# Patient Record
Sex: Female | Born: 1990 | Race: White | Hispanic: No | Smoking: Former smoker
Health system: Southern US, Community
[De-identification: ages and names within clinical notes are randomized; demographics above are authoritative.]

## PROBLEM LIST (undated history)

## (undated) DIAGNOSIS — L309 Dermatitis, unspecified: Secondary | ICD-10-CM

## (undated) DIAGNOSIS — J309 Allergic rhinitis, unspecified: Secondary | ICD-10-CM

## (undated) DIAGNOSIS — D219 Benign neoplasm of connective and other soft tissue, unspecified: Secondary | ICD-10-CM

## (undated) DIAGNOSIS — F419 Anxiety disorder, unspecified: Secondary | ICD-10-CM

## (undated) HISTORY — DX: Benign neoplasm of connective and other soft tissue, unspecified: D21.9

## (undated) HISTORY — DX: Anxiety disorder, unspecified: F41.9

## (undated) HISTORY — DX: Dermatitis, unspecified: L30.9

## (undated) HISTORY — DX: Allergic rhinitis, unspecified: J30.9

## (undated) HISTORY — PX: WISDOM TOOTH EXTRACTION: SHX21

---

## 2016-01-23 ENCOUNTER — Ambulatory Visit (INDEPENDENT_AMBULATORY_CARE_PROVIDER_SITE_OTHER): Payer: Self-pay | Admitting: Family Medicine

## 2016-01-23 ENCOUNTER — Encounter: Payer: Self-pay | Admitting: Family Medicine

## 2016-01-23 VITALS — BP 133/83 | HR 117 | Temp 98.8°F | Ht 66.0 in | Wt 258.0 lb

## 2016-01-23 DIAGNOSIS — H66001 Acute suppurative otitis media without spontaneous rupture of ear drum, right ear: Secondary | ICD-10-CM

## 2016-01-23 MED ORDER — AMOXICILLIN-POT CLAVULANATE 875-125 MG PO TABS: 1.0000 | ORAL_TABLET | Freq: Two times a day (BID) | ORAL | 0 refills | Status: DC

## 2016-01-23 MED ORDER — HYDROCOD POLST-CPM POLST ER 10-8 MG/5ML PO SUER: 5.0000 mL | Freq: Two times a day (BID) | ORAL | 0 refills | Status: DC | PRN

## 2016-01-23 MED ORDER — BENZONATATE 100 MG PO CAPS: 200.0000 mg | ORAL_CAPSULE | Freq: Three times a day (TID) | ORAL | 0 refills | Status: DC | PRN

## 2016-01-23 NOTE — Patient Instructions (Signed)
Follow up if no improvement 

## 2016-01-23 NOTE — Progress Notes (Signed)
   BP 133/83   Pulse (!) 117   Temp 98.8 F (37.1 C)   Ht 5\' 6"  (1.676 m)   Wt 258 lb (117 kg)   LMP 01/07/2016   SpO2 97%   BMI 41.64 kg/m    Subjective:    Patient ID: Mackenzie Kirby, female    DOB: 20-Apr-1990, 25 y.o.   MRN: [id number]  HPI: Mackenzie Kirby is a 25 y.o. female  Chief Complaint  Patient presents with  . URI    x 6 days, bilateral ear pain, nasal congestion, cough, raw throat. No chest congestion   B/l ear pain, productive cough, sweats, sore throat x 6 days. Denies fever, CP, SOB, wheezing. Taking nyquil with some relief. No sick contacts.   Relevant past medical, surgical, family and social history reviewed and updated as indicated. Interim medical history since our last visit reviewed. Allergies and medications reviewed and updated.  Review of Systems  Constitutional: Positive for diaphoresis.  HENT: Positive for ear pain and sore throat.   Eyes: Negative.   Respiratory: Positive for cough.   Cardiovascular: Negative.   Gastrointestinal: Negative.   Genitourinary: Negative.   Musculoskeletal: Negative.   Skin: Negative.   Neurological: Negative.   Psychiatric/Behavioral: Negative.     Per HPI unless specifically indicated above     Objective:    BP 133/83   Pulse (!) 117   Temp 98.8 F (37.1 C)   Ht 5\' 6"  (1.676 m)   Wt 258 lb (117 kg)   LMP 01/07/2016   SpO2 97%   BMI 41.64 kg/m   Wt Readings from Last 3 Encounters:  01/23/16 258 lb (117 kg)    Physical Exam  Constitutional: She is oriented to person, place, and time.  HENT:  Head: Atraumatic.  Right Ear: External ear normal.  Left Ear: External ear normal.  Right TM erythematous and edematous with moderate effusion Left TM with mild effusion, no erythema Oropharynx erythematous  Eyes: Conjunctivae are normal. Pupils are equal, round, and reactive to light. No scleral icterus.  Neck: Normal range of motion. Neck supple.  Cardiovascular: Normal rate and normal heart sounds.     Pulmonary/Chest: Effort normal and breath sounds normal. No respiratory distress. She has no wheezes. She has no rales.  Musculoskeletal: Normal range of motion.  Lymphadenopathy:    She has no cervical adenopathy.  Neurological: She is alert and oriented to person, place, and time.  Skin: Skin is warm and dry. No rash noted.  Psychiatric: She has a normal mood and affect. Her behavior is normal.  Nursing note and vitals reviewed.   No results found for this or any previous visit.    Assessment & Plan:   Problem List Items Addressed This Visit    None    Visit Diagnoses    Acute suppurative otitis media of right ear without spontaneous rupture of tympanic membrane, recurrence not specified    -  Primary   Augmentin sent, recommended sudafed and zyrtec to help reduce congestion. Supportive care discussed. F/u if no improvement   Relevant Medications   amoxicillin-clavulanate (AUGMENTIN) 875-125 MG tablet       Follow up plan: Return if symptoms worsen or fail to improve.

## 2016-11-30 ENCOUNTER — Encounter: Payer: Self-pay | Admitting: *Deleted

## 2016-11-30 ENCOUNTER — Other Ambulatory Visit: Payer: Self-pay

## 2016-11-30 ENCOUNTER — Emergency Department
Admission: EM | Admit: 2016-11-30 | Discharge: 2016-11-30 | Disposition: A | Discharge status: Home / Self Care | Payer: Self-pay | Attending: Emergency Medicine | Admitting: Emergency Medicine

## 2016-11-30 DIAGNOSIS — F17210 Nicotine dependence, cigarettes, uncomplicated: Secondary | ICD-10-CM | POA: Insufficient documentation

## 2016-11-30 DIAGNOSIS — Z79899 Other long term (current) drug therapy: Secondary | ICD-10-CM | POA: Insufficient documentation

## 2016-11-30 DIAGNOSIS — M25561 Pain in right knee: Secondary | ICD-10-CM | POA: Insufficient documentation

## 2016-11-30 MED ORDER — TRAMADOL HCL 50 MG PO TABS
50.0000 mg | ORAL_TABLET | Freq: Once | ORAL | Status: AC
  Administered 2016-11-30: 50 mg via ORAL
  Filled 2016-11-30: qty 1

## 2016-11-30 MED ORDER — KETOROLAC TROMETHAMINE 30 MG/ML IJ SOLN
30.0000 mg | Freq: Once | INTRAMUSCULAR | Status: AC
  Administered 2016-11-30: 30 mg via INTRAMUSCULAR
  Filled 2016-11-30: qty 1

## 2016-11-30 MED ORDER — IBUPROFEN 600 MG PO TABS: 600.0000 mg | ORAL_TABLET | Freq: Four times a day (QID) | ORAL | 0 refills | Status: DC | PRN

## 2016-11-30 NOTE — ED Triage Notes (Signed)
Pt complains of right knee pain and swelling, pt denies any other problems
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
## 2016-12-02 ENCOUNTER — Ambulatory Visit: Payer: Self-pay | Admitting: *Deleted

## 2016-12-02 NOTE — Telephone Encounter (Signed)
I have never seen this patient before and do not have any availability today. Mackenzie Kirby has no availability. [name] also has none- you can offer her an appointment tomorrow with Mackenzie Kirby, but unfortunately, we don't have anything today.

## 2016-12-02 NOTE — Telephone Encounter (Signed)
I called pt to follow up making an appt after hearing from the office on availability.   She mentioned she already had an appt with Rachael for in the morning.

## 2016-12-02 NOTE — Telephone Encounter (Signed)
Pt was seen in the ED Monday and told she has a sprained ligament in her right knee.  No x-rays or scans were done.  She has been taking Ibuprofen 600 mg as directed without any relief.   She is experiencing pain "10" on 0-10 pain scale.  She can barely move the right knee.   The right knee is very swollen.  She is in a great deal of pain.   I attempted to find an appt with any of the providers at [facility] but all the providers were booked.   I've sent a high priority note to the nurse pool there to see if they can work her in.  Reason for Disposition . [1] Can't move swollen joint at all AND [2] no fever  Answer Assessment - Initial Assessment Questions 1. LOCATION and RADIATION: "Where is the pain located?"      Right knee with shinn splints and muscle spasms 2. QUALITY: "What does the pain feel like?"  (e.g., sharp, dull, aching, burning)     Constant pain 3. SEVERITY: "How bad is the pain?" "What does it keep you from doing?"   (Scale 1-10; or mild, moderate, severe)   -  MILD (1-3): doesn't interfere with normal activities    -  MODERATE (4-7): interferes with normal activities (e.g., work or school) or awakens from sleep, limping    -  SEVERE (8-10): excruciating pain, unable to do any normal activities, unable to walk     10 4. ONSET: "When did the pain start?" "Does it come and go, or is it there all the time?"     Last week 5. RECURRENT: "Have you had this pain before?" If so, ask: "When, and what happened then?"     No 6. SETTING: "Has there been any recent work, exercise or other activity that involved that part of the body?"      I work long hours standing on my feet. 7. AGGRAVATING FACTORS: "What makes the knee pain worse?" (e.g., walking, climbing stairs, running)     Standing, walking, etc 8. ASSOCIATED SYMPTOMS: "Is there any swelling or redness of the knee?"     Yes swelling 9. OTHER SYMPTOMS: "Do you have any other symptoms?" (e.g., chest pain, difficulty  breathing, fever, calf pain)     No 10. PREGNANCY: "Is there any chance you are pregnant?" "When was your last menstrual period?"       Not asked  Protocols used: KNEE PAIN-A-AH

## 2016-12-03 ENCOUNTER — Encounter: Payer: Self-pay | Admitting: Family Medicine

## 2016-12-03 ENCOUNTER — Ambulatory Visit (INDEPENDENT_AMBULATORY_CARE_PROVIDER_SITE_OTHER): Payer: Self-pay | Admitting: Family Medicine

## 2016-12-03 VITALS — BP 128/84 | HR 99 | Temp 98.5°F

## 2016-12-03 DIAGNOSIS — M25561 Pain in right knee: Secondary | ICD-10-CM

## 2016-12-03 MED ORDER — BACLOFEN 20 MG PO TABS: 20.0000 mg | ORAL_TABLET | Freq: Three times a day (TID) | ORAL | 0 refills | Status: DC

## 2016-12-03 MED ORDER — HYDROCODONE-ACETAMINOPHEN 5-325 MG PO TABS: 1.0000 | ORAL_TABLET | Freq: Three times a day (TID) | ORAL | 0 refills | Status: DC | PRN

## 2016-12-03 MED ORDER — PREDNISONE 10 MG PO TABS: 10.0000 mg | ORAL_TABLET | Freq: Every day | ORAL | 0 refills | Status: DC
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
## 2016-12-06 NOTE — Patient Instructions (Signed)
Follow up as needed

## 2016-12-21 ENCOUNTER — Other Ambulatory Visit: Payer: Self-pay | Admitting: Family Medicine

## 2016-12-21 NOTE — Telephone Encounter (Signed)
Needs appt - was to be referred to ortho if no better

## 2016-12-21 NOTE — Telephone Encounter (Signed)
Mackenzie Kirby, Could you schedule appointment for patient?

## 2016-12-22 NOTE — Telephone Encounter (Signed)
Called pt. And left message for her to call back and schedule appointment.

## 2016-12-22 NOTE — Telephone Encounter (Signed)
Spoke with pt and she stated that she did not request this refill.

## 2017-05-11 ENCOUNTER — Other Ambulatory Visit: Payer: Self-pay

## 2017-05-11 ENCOUNTER — Encounter: Payer: Self-pay | Admitting: Emergency Medicine

## 2017-05-11 DIAGNOSIS — M545 Low back pain: Secondary | ICD-10-CM | POA: Insufficient documentation

## 2017-05-11 DIAGNOSIS — Z5321 Procedure and treatment not carried out due to patient leaving prior to being seen by health care provider: Secondary | ICD-10-CM | POA: Insufficient documentation

## 2017-05-11 NOTE — ED Triage Notes (Addendum)
Patient ambulatory to triage with steady gait, without difficulty or distress noted; pt reports lower back pain and bilat knee pain with no specific injury; st "may be from grabbing my socks and underwear out of my drawers"

## 2017-05-12 ENCOUNTER — Emergency Department
Admission: EM | Admit: 2017-05-12 | Discharge: 2017-05-12 | Disposition: A | Discharge status: Home / Self Care | Payer: Self-pay | Attending: Emergency Medicine | Admitting: Emergency Medicine

## 2017-06-08 ENCOUNTER — Encounter: Payer: Self-pay | Admitting: Family Medicine

## 2017-06-08 ENCOUNTER — Ambulatory Visit (INDEPENDENT_AMBULATORY_CARE_PROVIDER_SITE_OTHER): Payer: Self-pay | Admitting: Family Medicine

## 2017-06-08 VITALS — BP 127/86 | HR 92 | Temp 99.1°F | Wt 268.1 lb

## 2017-06-08 DIAGNOSIS — M25561 Pain in right knee: Secondary | ICD-10-CM

## 2017-06-08 DIAGNOSIS — R609 Edema, unspecified: Secondary | ICD-10-CM

## 2017-06-08 DIAGNOSIS — G8929 Other chronic pain: Secondary | ICD-10-CM

## 2017-06-08 DIAGNOSIS — M25562 Pain in left knee: Secondary | ICD-10-CM

## 2017-06-08 DIAGNOSIS — G2581 Restless legs syndrome: Secondary | ICD-10-CM

## 2017-06-08 DIAGNOSIS — R7301 Impaired fasting glucose: Secondary | ICD-10-CM | POA: Insufficient documentation

## 2017-06-08 LAB — BAYER DCA HB A1C WAIVED: HB A1C (BAYER DCA - WAIVED): 5.8 % (ref <7.0)

## 2017-06-08 MED ORDER — MELOXICAM 15 MG PO TABS: 15.0000 mg | ORAL_TABLET | Freq: Every day | ORAL | 3 refills | Status: DC

## 2017-06-08 NOTE — Progress Notes (Signed)
BP 127/86 (BP Location: Right Arm, Patient Position: Sitting, Cuff Size: Large)   Pulse 92   Temp 99.1 F (37.3 C)   Wt 268 lb 1 oz (121.6 kg)   SpO2 100%   BMI 43.27 kg/m    Subjective:    Patient ID: Mackenzie Kirby, female    DOB: [birth year], 27 y.o.   MRN: [id number]  HPI: Mackenzie Kirby is a 27 y.o. female  Chief Complaint  Patient presents with  . Knee Pain    bilateral pain, left has been hurting longer than the right knee  . RLS    Patient states that her knees jump really bad at night   KNEE PAIN- has been having pain in her knees. Pain got better following her injury 6 months ago. R knee has been hurting for about 3 months. L knee has been hurting about 3-4 months Duration: 3-4 months Involved knee: bilateral Mechanism of injury: unknown Location:anterior on R, L on the back and Lateral side Onset: gradual Severity: when not doing anything- 0, when doing something- severe  Quality:  aching Frequency: intermittent- just with standing and walking Radiation: yes- down L leg to her ankle Aggravating factors: weight bearing, walking, stairs, bending and movement  Alleviating factors: nothing  Status: worse Treatments attempted: epsom salt soaks, baclofen   Relief with NSAIDs?:  No NSAIDs Taken Weakness with weight bearing or walking: no Sensation of giving way: no Locking: yes Popping: yes Bruising: no Swelling: yes Redness: no Paresthesias/decreased sensation: no Fevers: no  Relevant past medical, surgical, family and social history reviewed and updated as indicated. Interim medical history since our last visit reviewed. Allergies and medications reviewed and updated.  Review of Systems  Constitutional: Positive for fatigue. Negative for activity change, appetite change, chills, diaphoresis, fever and unexpected weight change.  HENT: Negative.   Respiratory: Negative.   Cardiovascular: Negative.   Musculoskeletal: Positive for arthralgias. Negative for  back pain, gait problem, joint swelling, myalgias and neck pain.  Skin: Negative.   Neurological: Positive for numbness. Negative for dizziness, tremors, seizures, syncope, facial asymmetry, speech difficulty, weakness, light-headedness and headaches.  Psychiatric/Behavioral: Negative.     Per HPI unless specifically indicated above     Objective:    BP 127/86 (BP Location: Right Arm, Patient Position: Sitting, Cuff Size: Large)   Pulse 92   Temp 99.1 F (37.3 C)   Wt 268 lb 1 oz (121.6 kg)   SpO2 100%   BMI 43.27 kg/m   Wt Readings from Last 3 Encounters:  06/08/17 268 lb 1 oz (121.6 kg)  05/11/17 250 lb (113.4 kg)  11/30/16 240 lb (108.9 kg)    Physical Exam  Constitutional: She is oriented to person, place, and time. She appears well-developed and well-nourished. No distress.  HENT:  Head: Normocephalic and atraumatic.  Right Ear: Hearing normal.  Left Ear: Hearing normal.  Nose: Nose normal.  Eyes: Conjunctivae and lids are normal. Right eye exhibits no discharge. Left eye exhibits no discharge. No scleral icterus.  Cardiovascular: Normal rate, regular rhythm, normal heart sounds and intact distal pulses. Exam reveals no gallop and no friction rub.  No murmur heard. Pulmonary/Chest: Effort normal and breath sounds normal. No stridor. No respiratory distress. She has no wheezes. She has no rales. She exhibits no tenderness.  Musculoskeletal: Normal range of motion. She exhibits tenderness. She exhibits no edema or deformity.  Negative anterior and posterior drawer, negative mcmurrays, negative appleys compression and distraction, generalized tenderness to palpation  Neurological:  She is alert and oriented to person, place, and time.  Skin: Skin is warm, dry and intact. Capillary refill takes less than 2 seconds. No rash noted. She is not diaphoretic. No erythema. No pallor.  Psychiatric: She has a normal mood and affect. Her speech is normal and behavior is normal. Judgment  and thought content normal. Cognition and memory are normal.    No results found for this or any previous visit.    Assessment & Plan:   Problem List Items Addressed This Visit    None    Visit Diagnoses    Chronic pain of both knees    -  Primary   Declines x-rays. Will start meloxicam and give stretches. Call with any concerns.    Relevant Medications   meloxicam (MOBIC) 15 MG tablet   Edema, unspecified type       Will check labs. Start stretches. Call with any concerns.    Relevant Orders   Comprehensive metabolic panel   CBC with Differential/Platelet   Bayer DCA Hb A1c Waived   TSH   VITAMIN D 25 Hydroxy (Vit-D Deficiency, Fractures)   RLS (restless legs syndrome)       Will check labs. Start stretches. Call with any concerns.    Relevant Orders   Comprehensive metabolic panel   CBC with Differential/Platelet   Bayer DCA Hb A1c Waived   TSH   VITAMIN D 25 Hydroxy (Vit-D Deficiency, Fractures)       Follow up plan: Return if symptoms worsen or fail to improve.

## 2017-06-09 LAB — COMPREHENSIVE METABOLIC PANEL
ALT: 25 IU/L (ref 0–32)
AST: 17 IU/L (ref 0–40)
Albumin/Globulin Ratio: 1.7 (ref 1.2–2.2)
Albumin: 4.2 g/dL (ref 3.5–5.5)
Alkaline Phosphatase: 57 IU/L (ref 39–117)
BUN/Creatinine Ratio: 13 (ref 9–23)
BUN: 10 mg/dL (ref 6–20)
Bilirubin Total: <0.2 mg/dL (ref 0.0–1.2)
CO2: 23 mmol/L (ref 20–29)
CREATININE: 0.78 mg/dL (ref 0.57–1.00)
Calcium: 9.2 mg/dL (ref 8.7–10.2)
Chloride: 105 mmol/L (ref 96–106)
GFR calc Af Amer: 121 mL/min/{1.73_m2} (ref >59)
GFR, EST NON AFRICAN AMERICAN: 105 mL/min/{1.73_m2} (ref >59)
GLOBULIN, TOTAL: 2.5 g/dL (ref 1.5–4.5)
Glucose: 93 mg/dL (ref 65–99)
Potassium: 4.6 mmol/L (ref 3.5–5.2)
SODIUM: 139 mmol/L (ref 134–144)
Total Protein: 6.7 g/dL (ref 6.0–8.5)

## 2017-06-09 LAB — TSH: TSH: 2.08 u[IU]/mL (ref 0.450–4.500)

## 2017-06-09 LAB — CBC WITH DIFFERENTIAL/PLATELET
BASOS: 1 %
Basophils Absolute: 0 10*3/uL (ref 0.0–0.2)
EOS (ABSOLUTE): 0.2 10*3/uL (ref 0.0–0.4)
EOS: 3 %
Hematocrit: 41 % (ref 34.0–46.6)
Hemoglobin: 13.6 g/dL (ref 11.1–15.9)
IMMATURE GRANULOCYTES: 0 %
Immature Grans (Abs): 0 10*3/uL (ref 0.0–0.1)
Lymphocytes Absolute: 2 10*3/uL (ref 0.7–3.1)
Lymphs: 29 %
MCH: 27.6 pg (ref 26.6–33.0)
MCHC: 33.2 g/dL (ref 31.5–35.7)
MCV: 83 fL (ref 79–97)
MONOS ABS: 0.8 10*3/uL (ref 0.1–0.9)
Monocytes: 11 %
NEUTROS ABS: 3.9 10*3/uL (ref 1.4–7.0)
Neutrophils: 56 %
PLATELETS: 366 10*3/uL (ref 150–379)
RBC: 4.93 x10E6/uL (ref 3.77–5.28)
RDW: 13.8 % (ref 12.3–15.4)
WBC: 7 10*3/uL (ref 3.4–10.8)

## 2017-06-09 LAB — VITAMIN D 25 HYDROXY (VIT D DEFICIENCY, FRACTURES): VIT D 25 HYDROXY: 18.4 ng/mL — AB (ref 30.0–100.0)

## 2017-06-10 ENCOUNTER — Encounter: Payer: Self-pay | Admitting: Family Medicine

## 2017-10-04 ENCOUNTER — Ambulatory Visit (INDEPENDENT_AMBULATORY_CARE_PROVIDER_SITE_OTHER): Payer: Self-pay | Admitting: Family Medicine

## 2017-10-04 ENCOUNTER — Encounter: Payer: Self-pay | Admitting: Family Medicine

## 2017-10-04 ENCOUNTER — Other Ambulatory Visit: Payer: Self-pay

## 2017-10-04 VITALS — BP 133/83 | HR 102 | Temp 98.7°F | Ht 66.0 in | Wt 272.1 lb

## 2017-10-04 DIAGNOSIS — Z6841 Body Mass Index (BMI) 40.0 and over, adult: Secondary | ICD-10-CM

## 2017-10-04 DIAGNOSIS — M722 Plantar fascial fibromatosis: Secondary | ICD-10-CM

## 2017-10-04 DIAGNOSIS — E669 Obesity, unspecified: Secondary | ICD-10-CM | POA: Insufficient documentation

## 2017-10-04 MED ORDER — PREDNISONE 10 MG PO TABS
ORAL_TABLET | ORAL | 0 refills | Status: DC
Start: 2017-10-04 — End: 2017-12-22

## 2017-10-04 NOTE — Progress Notes (Signed)
BP 133/83   Pulse (!) 102   Temp 98.7 F (37.1 C) (Oral)   Ht 5\' 6"  (1.676 m)   Wt 272 lb 1.6 oz (123.4 kg)   SpO2 98%   BMI 43.92 kg/m    Subjective:    Patient ID: Mackenzie Kirby, female    DOB: 1990/08/26, 27 y.o.   MRN: [id number]  HPI: Mackenzie Kirby is a 26 y.o. female  Chief Complaint  Patient presents with  . Foot Pain    left foot x 1 month   Here today with left heel pain x 1 month. Worst in the morning, only hurts with weight bearing. Pain is sharp and searing. Not trying anything OTC. No known injury, discoloration, edema.   Also wanting to discuss something to help with her metabolism/weight loss. Feels she does not have an issue with over-eating, just that her metabolism is sluggish. Has cut back on soda intake lately without results. Does not exercise.   Past Medical History:  Diagnosis Date  . Allergic rhinitis    Social History   Socioeconomic History  . Marital status: Married    Spouse name: Not on file  . Number of children: Not on file  . Years of education: Not on file  . Highest education level: Not on file  Occupational History  . Not on file  Social Needs  . Financial resource strain: Not on file  . Food insecurity:    Worry: Not on file    Inability: Not on file  . Transportation needs:    Medical: Not on file    Non-medical: Not on file  Tobacco Use  . Smoking status: Current Every Day Smoker    Packs/day: 0.25    Types: Cigarettes  . Smokeless tobacco: Never Used  Substance and Sexual Activity  . Alcohol use: No    Comment: rare  . Drug use: No  . Sexual activity: Not on file  Lifestyle  . Physical activity:    Days per week: Not on file    Minutes per session: Not on file  . Stress: Not on file  Relationships  . Social connections:    Talks on phone: Not on file    Gets together: Not on file    Attends religious service: Not on file    Active member of club or organization: Not on file    Attends meetings of clubs or  organizations: Not on file    Relationship status: Not on file  . Intimate partner violence:    Fear of current or ex partner: Not on file    Emotionally abused: Not on file    Physically abused: Not on file    Forced sexual activity: Not on file  Other Topics Concern  . Not on file  Social History Narrative  . Not on file    Relevant past medical, surgical, family and social history reviewed and updated as indicated. Interim medical history since our last visit reviewed. Allergies and medications reviewed and updated.  Review of Systems  Per HPI unless specifically indicated above     Objective:    BP 133/83   Pulse (!) 102   Temp 98.7 F (37.1 C) (Oral)   Ht 5\' 6"  (1.676 m)   Wt 272 lb 1.6 oz (123.4 kg)   SpO2 98%   BMI 43.92 kg/m   Wt Readings from Last 3 Encounters:  10/04/17 272 lb 1.6 oz (123.4 kg)  06/08/17 268 lb 1 oz (121.6  kg)  05/11/17 250 lb (113.4 kg)    Physical Exam  Constitutional: She is oriented to person, place, and time. She appears well-developed and well-nourished. No distress.  HENT:  Head: Atraumatic.  Eyes: Conjunctivae and EOM are normal.  Neck: Normal range of motion. Neck supple.  Cardiovascular: Normal rate and normal heart sounds.  Pulmonary/Chest: Effort normal and breath sounds normal.  Musculoskeletal: Normal range of motion. She exhibits no edema or tenderness.  Neurological: She is alert and oriented to person, place, and time.  Skin: Skin is warm and dry.  Psychiatric: She has a normal mood and affect. Her behavior is normal.  Nursing note and vitals reviewed.   Results for orders placed or performed in visit on 06/08/17  Comprehensive metabolic panel  Result Value Ref Range   Glucose 93 65 - 99 mg/dL   BUN 10 6 - 20 mg/dL   Creatinine, Ser 0.78 0.57 - 1.00 mg/dL   GFR calc non Af Amer 105 >59 mL/min/1.73   GFR calc Af Amer 121 >59 mL/min/1.73   BUN/Creatinine Ratio 13 9 - 23   Sodium 139 134 - 144 mmol/L   Potassium  4.6 3.5 - 5.2 mmol/L   Chloride 105 96 - 106 mmol/L   CO2 23 20 - 29 mmol/L   Calcium 9.2 8.7 - 10.2 mg/dL   Total Protein 6.7 6.0 - 8.5 g/dL   Albumin 4.2 3.5 - 5.5 g/dL   Globulin, Total 2.5 1.5 - 4.5 g/dL   Albumin/Globulin Ratio 1.7 1.2 - 2.2   Bilirubin Total <0.2 0.0 - 1.2 mg/dL   Alkaline Phosphatase 57 39 - 117 IU/L   AST 17 0 - 40 IU/L   ALT 25 0 - 32 IU/L  CBC with Differential/Platelet  Result Value Ref Range   WBC 7.0 3.4 - 10.8 x10E3/uL   RBC 4.93 3.77 - 5.28 x10E6/uL   Hemoglobin 13.6 11.1 - 15.9 g/dL   Hematocrit 41.0 34.0 - 46.6 %   MCV 83 79 - 97 fL   MCH 27.6 26.6 - 33.0 pg   MCHC 33.2 31.5 - 35.7 g/dL   RDW 13.8 12.3 - 15.4 %   Platelets 366 150 - 379 x10E3/uL   Neutrophils 56 Not Estab. %   Lymphs 29 Not Estab. %   Monocytes 11 Not Estab. %   Eos 3 Not Estab. %   Basos 1 Not Estab. %   Neutrophils Absolute 3.9 1.4 - 7.0 x10E3/uL   Lymphocytes Absolute 2.0 0.7 - 3.1 x10E3/uL   Monocytes Absolute 0.8 0.1 - 0.9 x10E3/uL   EOS (ABSOLUTE) 0.2 0.0 - 0.4 x10E3/uL   Basophils Absolute 0.0 0.0 - 0.2 x10E3/uL   Immature Granulocytes 0 Not Estab. %   Immature Grans (Abs) 0.0 0.0 - 0.1 x10E3/uL  Bayer DCA Hb A1c Waived  Result Value Ref Range   HB A1C (BAYER DCA - WAIVED) 5.8 <7.0 %  TSH  Result Value Ref Range   TSH 2.080 0.450 - 4.500 uIU/mL  VITAMIN D 25 Hydroxy (Vit-D Deficiency, Fractures)  Result Value Ref Range   Vit D, 25-Hydroxy 18.4 (L) 30.0 - 100.0 ng/mL      Assessment & Plan:   Problem List Items Addressed This Visit      Other   Obesity    Long discussion today about multiple options, including nutritionist referral and several medications (wellbutrin, topamax, belviq, contrave). Encouraged her to start low carb, high protein diet and cut portions down. Emphasized importance of exercise/building muscle mass  for metabolism. Pt declines referral and medication at this point       Other Visit Diagnoses    Plantar fasciitis of left foot    -   Primary   Will start prednisone taper given duration. Roll out foot regularly with tennis ball or similar object, stretch calf, night brace, massage, tylenol/naproxen prn       Follow up plan: Return for CPE.

## 2017-10-05 NOTE — Assessment & Plan Note (Signed)
Long discussion today about multiple options, including nutritionist referral and several medications (wellbutrin, topamax, belviq, contrave). Encouraged her to start low carb, high protein diet and cut portions down. Emphasized importance of exercise/building muscle mass for metabolism. Pt declines referral and medication at this point

## 2017-10-05 NOTE — Patient Instructions (Signed)
Follow up for CPE 

## 2017-11-08 ENCOUNTER — Encounter: Payer: Self-pay | Admitting: Family Medicine

## 2017-12-14 ENCOUNTER — Encounter: Payer: Self-pay | Admitting: Family Medicine

## 2017-12-22 ENCOUNTER — Other Ambulatory Visit (HOSPITAL_COMMUNITY)
Admission: RE | Admit: 2017-12-22 | Discharge: 2017-12-22 | Disposition: A | Discharge status: Home / Self Care | Payer: Self-pay | Source: Ambulatory Visit | Attending: Family Medicine | Admitting: Family Medicine

## 2017-12-22 ENCOUNTER — Ambulatory Visit: Payer: Self-pay | Admitting: Family Medicine

## 2017-12-22 ENCOUNTER — Encounter: Payer: Self-pay | Admitting: Family Medicine

## 2017-12-22 VITALS — BP 152/76 | HR 106 | Temp 99.4°F | Ht 66.6 in | Wt 271.2 lb

## 2017-12-22 DIAGNOSIS — Z0000 Encounter for general adult medical examination without abnormal findings: Secondary | ICD-10-CM

## 2017-12-22 DIAGNOSIS — Z124 Encounter for screening for malignant neoplasm of cervix: Secondary | ICD-10-CM

## 2017-12-22 DIAGNOSIS — G8929 Other chronic pain: Secondary | ICD-10-CM

## 2017-12-22 DIAGNOSIS — G2581 Restless legs syndrome: Secondary | ICD-10-CM

## 2017-12-22 DIAGNOSIS — M25561 Pain in right knee: Secondary | ICD-10-CM

## 2017-12-22 LAB — UA/M W/RFLX CULTURE, ROUTINE
Bilirubin, UA: NEGATIVE
GLUCOSE, UA: NEGATIVE
Ketones, UA: NEGATIVE
Leukocytes, UA: NEGATIVE
Nitrite, UA: NEGATIVE
PROTEIN UA: NEGATIVE
Specific Gravity, UA: 1.02 (ref 1.005–1.030)
Urobilinogen, Ur: 0.2 mg/dL (ref 0.2–1.0)
pH, UA: 5.5 (ref 5.0–7.5)

## 2017-12-22 LAB — MICROSCOPIC EXAMINATION

## 2017-12-22 MED ORDER — DICLOFENAC SODIUM 1 % TD GEL: 2.0000 g | Freq: Four times a day (QID) | TRANSDERMAL | 0 refills | Status: DC

## 2017-12-22 NOTE — Progress Notes (Signed)
BP (!) 152/76 (BP Location: Left Arm, Patient Position: Sitting, Cuff Size: Normal)   Pulse (!) 106   Temp 99.4 F (37.4 C)   Ht 5' 6.6" (1.692 m)   Wt 271 lb 3 oz (123 kg)   SpO2 100%   BMI 42.99 kg/m    Subjective:    Patient ID: Mackenzie Kirby, female    DOB: 27-Dec-1990, 27 y.o.   MRN: [id number]  HPI: Mackenzie Kirby is a 27 y.o. female presenting on 12/22/2017 for comprehensive medical examination. Current medical complaints include:see below  Still having chronic right knee pain and weakness since spraining it earlier this year. Taking occasional ibuprofen with some relief. Tries to not overwork the leg as this makes things worse. Minimal swelling, some occasional redness. No numbness, tingling.   Also having significant issues with RLS at night. Was told previously that she had a vitamin deficiency but can't remember which, thinks this contributes.   She currently lives with: Menopausal Symptoms: no  Depression Screen done today and results listed below:  Depression screen [facility] 2/9 06/08/2017  Decreased Interest 1  Down, Depressed, Hopeless 1  PHQ - 2 Score 2  Altered sleeping 3  Tired, decreased energy 1  Change in appetite 1  Feeling bad or failure about yourself  0  Trouble concentrating 1  Moving slowly or fidgety/restless 3  Suicidal thoughts 1  PHQ-9 Score 12  Difficult doing work/chores Somewhat difficult    The patient does not have a history of falls. I did not complete a risk assessment for falls. A plan of care for falls was not documented.   Past Medical History:  Past Medical History:  Diagnosis Date  . Allergic rhinitis     Surgical History:  Past Surgical History:  Procedure Laterality Date  . WISDOM TOOTH EXTRACTION      Medications:  No current outpatient medications on file prior to visit.   No current facility-administered medications on file prior to visit.     Allergies:  No Known Allergies  Social History:  Social History     Socioeconomic History  . Marital status: Married    Spouse name: Not on file  . Number of children: Not on file  . Years of education: Not on file  . Highest education level: Not on file  Occupational History  . Not on file  Social Needs  . Financial resource strain: Not on file  . Food insecurity:    Worry: Not on file    Inability: Not on file  . Transportation needs:    Medical: Not on file    Non-medical: Not on file  Tobacco Use  . Smoking status: Current Every Day Smoker    Packs/day: 0.25    Types: Cigarettes  . Smokeless tobacco: Never Used  Substance and Sexual Activity  . Alcohol use: No    Comment: rare  . Drug use: No  . Sexual activity: Not on file  Lifestyle  . Physical activity:    Days per week: Not on file    Minutes per session: Not on file  . Stress: Not on file  Relationships  . Social connections:    Talks on phone: Not on file    Gets together: Not on file    Attends religious service: Not on file    Active member of club or organization: Not on file    Attends meetings of clubs or organizations: Not on file    Relationship status: Not on file  .  Intimate partner violence:    Fear of current or ex partner: Not on file    Emotionally abused: Not on file    Physically abused: Not on file    Forced sexual activity: Not on file  Other Topics Concern  . Not on file  Social History Narrative  . Not on file   Social History   Tobacco Use  Smoking Status Current Every Day Smoker  . Packs/day: 0.25  . Types: Cigarettes  Smokeless Tobacco Never Used   Social History   Substance and Sexual Activity  Alcohol Use No   Comment: rare    Family History:  Family History  Problem Relation Age of Onset  . Gestational diabetes Mother     Past medical history, surgical history, medications, allergies, family history and social history reviewed with patient today and changes made to appropriate areas of the chart.   Review of Systems -  General ROS: negative Psychological ROS: negative Ophthalmic ROS: negative ENT ROS: negative Allergy and Immunology ROS: negative Hematological and Lymphatic ROS: negative Endocrine ROS: negative Breast ROS: negative for breast lumps Respiratory ROS: no cough, shortness of breath, or wheezing Cardiovascular ROS: no chest pain or dyspnea on exertion Gastrointestinal ROS: no abdominal pain, change in bowel habits, or black or bloody stools Genito-Urinary ROS: no dysuria, trouble voiding, or hematuria Musculoskeletal ROS: positive for - joint pain Neurological ROS: no TIA or stroke symptoms Dermatological ROS: negative All other ROS negative except what is listed above and in the HPI.      Objective:    BP (!) 152/76 (BP Location: Left Arm, Patient Position: Sitting, Cuff Size: Normal)   Pulse (!) 106   Temp 99.4 F (37.4 C)   Ht 5' 6.6" (1.692 m)   Wt 271 lb 3 oz (123 kg)   SpO2 100%   BMI 42.99 kg/m   Wt Readings from Last 3 Encounters:  12/22/17 271 lb 3 oz (123 kg)  10/04/17 272 lb 1.6 oz (123.4 kg)  06/08/17 268 lb 1 oz (121.6 kg)    Physical Exam  Constitutional: She is oriented to person, place, and time. She appears well-developed and well-nourished. No distress.  HENT:  Head: Atraumatic.  Right Ear: External ear normal.  Left Ear: External ear normal.  Nose: Nose normal.  Mouth/Throat: Oropharynx is clear and moist. No oropharyngeal exudate.  Eyes: Pupils are equal, round, and reactive to light. Conjunctivae are normal. No scleral icterus.  Neck: Normal range of motion. Neck supple. No thyromegaly present.  Cardiovascular: Normal rate, regular rhythm, normal heart sounds and intact distal pulses.  Pulmonary/Chest: Effort normal and breath sounds normal. No respiratory distress.  Abdominal: Soft. Bowel sounds are normal. She exhibits no mass. There is no tenderness.  Genitourinary: Vagina normal. No vaginal discharge found.  Musculoskeletal: Normal range of  motion. She exhibits no edema or tenderness.  Lymphadenopathy:    She has no cervical adenopathy.  Neurological: She is alert and oriented to person, place, and time. No cranial nerve deficit.  Skin: Skin is warm and dry. No rash noted.  Psychiatric: She has a normal mood and affect. Her behavior is normal.  Nursing note and vitals reviewed.   Results for orders placed or performed in visit on 12/22/17  Microscopic Examination  Result Value Ref Range   WBC, UA 0-5 0 - 5 /hpf   RBC, UA 0-2 0 - 2 /hpf   Epithelial Cells (non renal) 0-10 0 - 10 /hpf   Bacteria, UA Few  None seen/Few  CBC with Differential/Platelet  Result Value Ref Range   WBC 7.7 3.4 - 10.8 x10E3/uL   RBC 5.17 3.77 - 5.28 x10E6/uL   Hemoglobin 14.2 11.1 - 15.9 g/dL   Hematocrit 42.8 34.0 - 46.6 %   MCV 83 79 - 97 fL   MCH 27.5 26.6 - 33.0 pg   MCHC 33.2 31.5 - 35.7 g/dL   RDW 12.9 12.3 - 15.4 %   Platelets 400 150 - 450 x10E3/uL   Neutrophils 63 Not Estab. %   Lymphs 25 Not Estab. %   Monocytes 8 Not Estab. %   Eos 2 Not Estab. %   Basos 1 Not Estab. %   Neutrophils Absolute 4.9 1.4 - 7.0 x10E3/uL   Lymphocytes Absolute 1.9 0.7 - 3.1 x10E3/uL   Monocytes Absolute 0.7 0.1 - 0.9 x10E3/uL   EOS (ABSOLUTE) 0.2 0.0 - 0.4 x10E3/uL   Basophils Absolute 0.1 0.0 - 0.2 x10E3/uL   Immature Granulocytes 1 Not Estab. %   Immature Grans (Abs) 0.1 0.0 - 0.1 x10E3/uL  Comprehensive metabolic panel  Result Value Ref Range   Glucose 97 65 - 99 mg/dL   BUN 9 6 - 20 mg/dL   Creatinine, Ser 0.77 0.57 - 1.00 mg/dL   GFR calc non Af Amer 107 >59 mL/min/1.73   GFR calc Af Amer 123 >59 mL/min/1.73   BUN/Creatinine Ratio 12 9 - 23   Sodium 138 134 - 144 mmol/L   Potassium 4.2 3.5 - 5.2 mmol/L   Chloride 100 96 - 106 mmol/L   CO2 22 20 - 29 mmol/L   Calcium 9.9 8.7 - 10.2 mg/dL   Total Protein 6.4 6.0 - 8.5 g/dL   Albumin 4.4 3.5 - 5.5 g/dL   Globulin, Total 2.0 1.5 - 4.5 g/dL   Albumin/Globulin Ratio 2.2 1.2 - 2.2    Bilirubin Total 0.3 0.0 - 1.2 mg/dL   Alkaline Phosphatase 66 39 - 117 IU/L   AST 26 0 - 40 IU/L   ALT 40 (H) 0 - 32 IU/L  Lipid Panel w/o Chol/HDL Ratio  Result Value Ref Range   Cholesterol, Total 164 100 - 199 mg/dL   Triglycerides 192 (H) 0 - 149 mg/dL   HDL 32 (L) >39 mg/dL   VLDL Cholesterol Cal 38 5 - 40 mg/dL   LDL Calculated 94 0 - 99 mg/dL  TSH  Result Value Ref Range   TSH 1.580 0.450 - 4.500 uIU/mL  UA/M w/rflx Culture, Routine  Result Value Ref Range   Specific Gravity, UA 1.020 1.005 - 1.030   pH, UA 5.5 5.0 - 7.5   Color, UA Yellow Yellow   Appearance Ur Cloudy (A) Clear   Leukocytes, UA Negative Negative   Protein, UA Negative Negative/Trace   Glucose, UA Negative Negative   Ketones, UA Negative Negative   RBC, UA Trace (A) Negative   Bilirubin, UA Negative Negative   Urobilinogen, Ur 0.2 0.2 - 1.0 mg/dL   Nitrite, UA Negative Negative   Microscopic Examination See below:   Vitamin B12  Result Value Ref Range   Vitamin B-12 590 232 - 1,245 pg/mL  Vitamin D (25 hydroxy)  Result Value Ref Range   Vit D, 25-Hydroxy 13.4 (L) 30.0 - 100.0 ng/mL      Assessment & Plan:   Problem List Items Addressed This Visit    None    Visit Diagnoses    Chronic pain of right knee    -  Primary  Diclofenac gel sent, referral to PT placed for strengthening and home exercises.    Relevant Orders   Ambulatory referral to Physical Therapy   Annual physical exam       Relevant Orders   CBC with Differential/Platelet (Completed)   Comprehensive metabolic panel (Completed)   Lipid Panel w/o Chol/HDL Ratio (Completed)   TSH (Completed)   UA/M w/rflx Culture, Routine (Completed)   Restless legs       Will check vitamin levels as well as blood counts to r/o organic causes. Recommended OTC supplements. F/u if not improving   Relevant Orders   Vitamin B12 (Completed)   Vitamin D (25 hydroxy) (Completed)   Screening for cervical cancer       Relevant Orders   Cytology -  PAP       Follow up plan: Return in about 1 year (around 12/23/2018) for CPE.   LABORATORY TESTING:  - Pap smear: pap done  IMMUNIZATIONS:   - Tdap: Tetanus vaccination status reviewed: last tetanus booster within 10 years. - Influenza: Refused  PATIENT COUNSELING:   Advised to take 1 mg of folate supplement per day if capable of pregnancy.   Sexuality: Discussed sexually transmitted diseases, partner selection, use of condoms, avoidance of unintended pregnancy  and contraceptive alternatives.   Advised to avoid cigarette smoking.  I discussed with the patient that most people either abstain from alcohol or drink within safe limits (<=14/week and <=4 drinks/occasion for males, <=7/weeks and <= 3 drinks/occasion for females) and that the risk for alcohol disorders and other health effects rises proportionally with the number of drinks per week and how often a drinker exceeds daily limits.  Discussed cessation/primary prevention of drug use and availability of treatment for abuse.   Diet: Encouraged to adjust caloric intake to maintain  or achieve ideal body weight, to reduce intake of dietary saturated fat and total fat, to limit sodium intake by avoiding high sodium foods and not adding table salt, and to maintain adequate dietary potassium and calcium preferably from fresh fruits, vegetables, and low-fat dairy products.    stressed the importance of regular exercise  Injury prevention: Discussed safety belts, safety helmets, smoke detector, smoking near bedding or upholstery.   Dental health: Discussed importance of regular tooth brushing, flossing, and dental visits.    NEXT PREVENTATIVE PHYSICAL DUE IN 1 YEAR. Return in about 1 year (around 12/23/2018) for CPE.

## 2017-12-23 LAB — VITAMIN D 25 HYDROXY (VIT D DEFICIENCY, FRACTURES): Vit D, 25-Hydroxy: 13.4 ng/mL — ABNORMAL LOW (ref 30.0–100.0)

## 2017-12-23 LAB — CBC WITH DIFFERENTIAL/PLATELET
Basophils Absolute: 0.1 10*3/uL (ref 0.0–0.2)
Basos: 1 %
EOS (ABSOLUTE): 0.2 10*3/uL (ref 0.0–0.4)
Eos: 2 %
HEMOGLOBIN: 14.2 g/dL (ref 11.1–15.9)
Hematocrit: 42.8 % (ref 34.0–46.6)
IMMATURE GRANULOCYTES: 1 %
Immature Grans (Abs): 0.1 10*3/uL (ref 0.0–0.1)
LYMPHS ABS: 1.9 10*3/uL (ref 0.7–3.1)
Lymphs: 25 %
MCH: 27.5 pg (ref 26.6–33.0)
MCHC: 33.2 g/dL (ref 31.5–35.7)
MCV: 83 fL (ref 79–97)
MONOCYTES: 8 %
Monocytes Absolute: 0.7 10*3/uL (ref 0.1–0.9)
Neutrophils Absolute: 4.9 10*3/uL (ref 1.4–7.0)
Neutrophils: 63 %
Platelets: 400 10*3/uL (ref 150–450)
RBC: 5.17 x10E6/uL (ref 3.77–5.28)
RDW: 12.9 % (ref 12.3–15.4)
WBC: 7.7 10*3/uL (ref 3.4–10.8)

## 2017-12-23 LAB — COMPREHENSIVE METABOLIC PANEL
A/G RATIO: 2.2 (ref 1.2–2.2)
ALT: 40 IU/L — AB (ref 0–32)
AST: 26 IU/L (ref 0–40)
Albumin: 4.4 g/dL (ref 3.5–5.5)
Alkaline Phosphatase: 66 IU/L (ref 39–117)
BUN/Creatinine Ratio: 12 (ref 9–23)
BUN: 9 mg/dL (ref 6–20)
Bilirubin Total: 0.3 mg/dL (ref 0.0–1.2)
CALCIUM: 9.9 mg/dL (ref 8.7–10.2)
CO2: 22 mmol/L (ref 20–29)
Chloride: 100 mmol/L (ref 96–106)
Creatinine, Ser: 0.77 mg/dL (ref 0.57–1.00)
GFR calc non Af Amer: 107 mL/min/{1.73_m2} (ref >59)
GFR, EST AFRICAN AMERICAN: 123 mL/min/{1.73_m2} (ref >59)
Globulin, Total: 2 g/dL (ref 1.5–4.5)
Glucose: 97 mg/dL (ref 65–99)
POTASSIUM: 4.2 mmol/L (ref 3.5–5.2)
Sodium: 138 mmol/L (ref 134–144)
TOTAL PROTEIN: 6.4 g/dL (ref 6.0–8.5)

## 2017-12-23 LAB — LIPID PANEL W/O CHOL/HDL RATIO
Cholesterol, Total: 164 mg/dL (ref 100–199)
HDL: 32 mg/dL — AB (ref >39)
LDL CALC: 94 mg/dL (ref 0–99)
Triglycerides: 192 mg/dL — ABNORMAL HIGH (ref 0–149)
VLDL CHOLESTEROL CAL: 38 mg/dL (ref 5–40)

## 2017-12-23 LAB — TSH: TSH: 1.58 u[IU]/mL (ref 0.450–4.500)

## 2017-12-23 LAB — VITAMIN B12: VITAMIN B 12: 590 pg/mL (ref 232–1245)

## 2017-12-27 LAB — CYTOLOGY - PAP: Diagnosis: NEGATIVE

## 2017-12-28 ENCOUNTER — Emergency Department: Payer: Self-pay

## 2017-12-28 ENCOUNTER — Emergency Department
Admission: EM | Admit: 2017-12-28 | Discharge: 2017-12-28 | Disposition: A | Discharge status: Home / Self Care | Payer: Self-pay | Attending: Emergency Medicine | Admitting: Emergency Medicine

## 2017-12-28 ENCOUNTER — Other Ambulatory Visit: Payer: Self-pay

## 2017-12-28 DIAGNOSIS — F17210 Nicotine dependence, cigarettes, uncomplicated: Secondary | ICD-10-CM | POA: Insufficient documentation

## 2017-12-28 DIAGNOSIS — R102 Pelvic and perineal pain: Secondary | ICD-10-CM

## 2017-12-28 DIAGNOSIS — D219 Benign neoplasm of connective and other soft tissue, unspecified: Secondary | ICD-10-CM | POA: Insufficient documentation

## 2017-12-28 LAB — URINALYSIS, COMPLETE (UACMP) WITH MICROSCOPIC
Bilirubin Urine: NEGATIVE
Glucose, UA: NEGATIVE mg/dL
Hgb urine dipstick: NEGATIVE
Ketones, ur: NEGATIVE mg/dL
Leukocytes, UA: NEGATIVE
Nitrite: NEGATIVE
Protein, ur: 30 mg/dL — AB
Specific Gravity, Urine: 1.025 (ref 1.005–1.030)
pH: 5 (ref 5.0–8.0)

## 2017-12-28 LAB — CBC
HCT: 42.7 % (ref 36.0–46.0)
Hemoglobin: 14.3 g/dL (ref 12.0–15.0)
MCH: 27.7 pg (ref 26.0–34.0)
MCHC: 33.5 g/dL (ref 30.0–36.0)
MCV: 82.8 fL (ref 80.0–100.0)
Platelets: 362 10*3/uL (ref 150–400)
RBC: 5.16 MIL/uL — ABNORMAL HIGH (ref 3.87–5.11)
RDW: 12.7 % (ref 11.5–15.5)
WBC: 9.2 10*3/uL (ref 4.0–10.5)
nRBC: 0 % (ref 0.0–0.2)

## 2017-12-28 LAB — COMPREHENSIVE METABOLIC PANEL
ALT: 37 U/L (ref 0–44)
AST: 28 U/L (ref 15–41)
Albumin: 4.1 g/dL (ref 3.5–5.0)
Alkaline Phosphatase: 58 U/L (ref 38–126)
Anion gap: 11 (ref 5–15)
BUN: 9 mg/dL (ref 6–20)
CALCIUM: 9.1 mg/dL (ref 8.9–10.3)
CO2: 22 mmol/L (ref 22–32)
Chloride: 104 mmol/L (ref 98–111)
Creatinine, Ser: 0.63 mg/dL (ref 0.44–1.00)
GFR calc non Af Amer: >60 mL/min (ref >60)
Glucose, Bld: 114 mg/dL — ABNORMAL HIGH (ref 70–99)
Potassium: 4.1 mmol/L (ref 3.5–5.1)
SODIUM: 137 mmol/L (ref 135–145)
Total Bilirubin: 0.5 mg/dL (ref 0.3–1.2)
Total Protein: 7 g/dL (ref 6.5–8.1)

## 2017-12-28 LAB — LIPASE, BLOOD: Lipase: 37 U/L (ref 11–51)

## 2017-12-28 LAB — POCT PREGNANCY, URINE: Preg Test, Ur: NEGATIVE

## 2017-12-28 NOTE — ED Triage Notes (Signed)
Pt c/o nausea, lower abdominal cramping that started approx 1 hour ago. Pt alert and oriented X4, active, cooperative, pt in NAD. RR even and unlabored, color WNL.

## 2017-12-28 NOTE — ED Provider Notes (Signed)
[facility] Emergency Department Provider Note  ___________________________________________   First MD Initiated Contact with Patient 12/28/17 1430     (approximate)  I have reviewed the triage vital signs and the nursing notes.   HISTORY  Chief Complaint Abdominal Pain   HPI Mackenzie Kirby is a 27 y.o. female with a history of multiple years of painful periods was presented to the emergency department today with lower abdominal pain across the lower abdomen radiating through her back.  She says that the pain was severe and intermittent lasting several minutes while she was driving to work earlier today.  Says the pain maxed out an 8 out of 10 and is now 2 out of 10.  Pain is sharp and radiates through to her back.  Denies any vaginal discharge or bleeding.  Says that she just had a recent normal Pap smear this past Wednesday.  Denies any vaginal discharge or bleeding.  She is here with her boyfriend and says that he is recently tested negative for any STDs.  Patient denies any diarrhea.   Past Medical History:  Diagnosis Date  . Allergic rhinitis     Patient Active Problem List   Diagnosis Date Noted  . Obesity 10/04/2017  . IFG (impaired fasting glucose) 06/08/2017    Past Surgical History:  Procedure Laterality Date  . WISDOM TOOTH EXTRACTION      Prior to Admission medications   Medication Sig Start Date End Date Taking? Authorizing Provider  diclofenac sodium (VOLTAREN) 1 % GEL Apply 2 g topically 4 (four) times daily. 12/22/17   Volney American, PA-C    Allergies Patient has no known allergies.  Family History  Problem Relation Age of Onset  . Gestational diabetes Mother     Social History Social History   Tobacco Use  . Smoking status: Current Every Day Smoker    Packs/day: 0.25    Types: Cigarettes  . Smokeless tobacco: Never Used  Substance Use Topics  . Alcohol use: No    Comment: rare  . Drug use: No    Review  of Systems  Constitutional: No fever/chills Eyes: No visual changes. ENT: No sore throat. Cardiovascular: Denies chest pain. Respiratory: Denies shortness of breath. Gastrointestinal: no vomiting.  No diarrhea.  No constipation. Genitourinary: Negative for dysuria. Musculoskeletal: As above Skin: Negative for rash. Neurological: Negative for headaches, focal weakness or numbness.   ____________________________________________   PHYSICAL EXAM:  VITAL SIGNS: ED Triage Vitals [12/28/17 1317]  Enc Vitals Group     BP (!) 135/95     Pulse Rate (!) 108     Resp 16     Temp 98.4 F (36.9 C)     Temp Source Oral     SpO2 100 %     Weight 271 lb 2.7 oz (123 kg)     Height 5\' 6"  (1.676 m)     Head Circumference      Peak Flow      Pain Score 2     Pain Loc      Pain Edu?      Excl. in [place]?     Constitutional: Alert and oriented. Well appearing and in no acute distress. Eyes: Conjunctivae are normal.  Head: Atraumatic. Nose: No congestion/rhinnorhea. Mouth/Throat: Mucous membranes are moist.  Neck: No stridor.   Cardiovascular: Normal rate, regular rhythm. Grossly normal heart sounds.    Respiratory: Normal respiratory effort.  No retractions. Lungs CTAB. Gastrointestinal: Soft with mild to moderate tenderness  across the lower abdomen without any focal tenderness to palpation.  No rebound or guarding.  No distention. No CVA tenderness. Musculoskeletal: No lower extremity tenderness nor edema.   Neurologic:  Normal speech and language. No gross focal neurologic deficits are appreciated. Skin:  Skin is warm, dry and intact. No rash noted. Psychiatric: Mood and affect are normal. Speech and behavior are normal.  ____________________________________________   LABS (all labs ordered are listed, but only abnormal results are displayed)  Labs Reviewed  COMPREHENSIVE METABOLIC PANEL - Abnormal; Notable for the following components:      Result Value   Glucose, Bld 114 (*)     All other components within normal limits  CBC - Abnormal; Notable for the following components:   RBC 5.16 (*)    All other components within normal limits  URINALYSIS, COMPLETE (UACMP) WITH MICROSCOPIC - Abnormal; Notable for the following components:   Color, Urine YELLOW (*)    APPearance CLEAR (*)    Protein, ur 30 (*)    Bacteria, UA RARE (*)    All other components within normal limits  LIPASE, BLOOD  POC URINE PREG, ED  POCT PREGNANCY, URINE   ____________________________________________  EKG   ____________________________________________  RADIOLOGY  Probable posterior right-sided fundal fibroid measuring up to 3 cm in diameter.  Otherwise there is a normal-appearing uterus.  Normal appearance of both ovaries. ____________________________________________   PROCEDURES  Procedure(s) performed:   Procedures  Critical Care performed:   ____________________________________________   INITIAL IMPRESSION / ASSESSMENT AND PLAN / ED COURSE  Pertinent labs & imaging results that were available during my care of the patient were reviewed by me and considered in my medical decision making (see chart for details).  Differential diagnosis includes, but is not limited to, ovarian cyst, ovarian torsion, acute appendicitis, diverticulitis, urinary tract infection/pyelonephritis, endometriosis, bowel obstruction, colitis, renal colic, gastroenteritis, hernia, fibroids, endometriosis, pregnancy related pain including ectopic pregnancy, etc. As part of my medical decision making, I reviewed the following data within the electronic MEDICAL RECORD NUMBER Notes from prior ED visits  ----------------------------------------- 4:25 PM on 12/28/2017 -----------------------------------------  Patient at this time without any objective signs of distress.  Resting in her bed, talking her mother on her cell phone.  Patient still rates pain as 2 out of 10.  Still with mild tenderness across lower  abdomen.  Patient says that she usually takes Midol for her menstrual cramping.  I believe this to be appropriate at this time for her current symptoms.  She is aware of the fibroid and will be following up with OB/GYN.  Patient without localizing tenderness.  No GI symptoms.  Normal white blood cell count.  Patient to return for any worsening or concerning symptoms.  However, at this time her exam and lab work does not point to an acute appendicitis or other surgical issue.  She is understanding of the diagnosis and treatment and willing to comply but will be discharged at this time. ____________________________________________   FINAL CLINICAL IMPRESSION(S) / ED DIAGNOSES  Final diagnoses:  Pelvic pain  Pelvic pain   Uterine fibroid.   NEW MEDICATIONS STARTED DURING THIS VISIT:  New Prescriptions   No medications on file     Note:  This document was prepared using Dragon voice recognition software and may include unintentional dictation errors.     Orbie Pyo, MD 12/28/17 [phone]

## 2017-12-28 NOTE — ED Notes (Signed)
Pt c/o 2/10 lower abd cramping that started today. Denies N/V/D/Fever

## 2018-01-03 ENCOUNTER — Encounter: Payer: Self-pay | Admitting: Certified Nurse Midwife

## 2018-01-03 ENCOUNTER — Ambulatory Visit (INDEPENDENT_AMBULATORY_CARE_PROVIDER_SITE_OTHER): Payer: Self-pay | Admitting: Certified Nurse Midwife

## 2018-01-03 VITALS — BP 125/79 | HR 109 | Ht 66.0 in | Wt 272.0 lb

## 2018-01-03 DIAGNOSIS — D259 Leiomyoma of uterus, unspecified: Secondary | ICD-10-CM

## 2018-01-03 DIAGNOSIS — R102 Pelvic and perineal pain: Secondary | ICD-10-CM

## 2018-01-03 DIAGNOSIS — N920 Excessive and frequent menstruation with regular cycle: Secondary | ICD-10-CM

## 2018-01-03 NOTE — Progress Notes (Signed)
GYN ENCOUNTER NOTE  Subjective:       Mackenzie Kirby is a 27 y.o. G0P0000 female here for ER follow visit. Seen in [facility] ER for back pain, pelvic pain, and lower abdominal pain. Found to have "golf ball sized" uterine fibroid.   Patient has been researching her options and would like to have her fibroid removed. Taking Midol or Tylenol to manage symptoms at this time.   Denies difficulty breathing or respiratory distress, chest pain, abdominal pain, excessive vaginal bleeding, dysuria, and leg pain or swelling.    Gynecologic History Patient's last menstrual period was 12/06/2017 (approximate).  Period Cycle (Days): 28 Period Duration (Days): 8-9 Period Pattern: Regular Menstrual Flow: Heavy Menstrual Control: Tampon, Maxi pad Dysmenorrhea: (!) Severe Dysmenorrhea Symptoms: Cramping  Contraception: none   Last Pap: 12/22/2017. Results were: normal  Obstetric History  OB History  Gravida Para Term Preterm AB Living  0 0 0 0 0 0  SAB TAB Ectopic Multiple Live Births  0 0 0 0 0    Past Medical History:  Diagnosis Date  . Allergic rhinitis   . Fibroid     Past Surgical History:  Procedure Laterality Date  . WISDOM TOOTH EXTRACTION      Current Outpatient Medications on File Prior to Visit  Medication Sig Dispense Refill  . diclofenac sodium (VOLTAREN) 1 % GEL Apply 2 g topically 4 (four) times daily. 100 g 0   No current facility-administered medications on file prior to visit.     No Known Allergies  Social History   Socioeconomic History  . Marital status: Married    Spouse name: Not on file  . Number of children: Not on file  . Years of education: Not on file  . Highest education level: Not on file  Occupational History  . Not on file  Social Needs  . Financial resource strain: Not on file  . Food insecurity:    Worry: Not on file    Inability: Not on file  . Transportation needs:    Medical: Not on file    Non-medical: Not on file  Tobacco Use  .  Smoking status: Current Every Day Smoker    Packs/day: 0.25    Types: Cigarettes  . Smokeless tobacco: Never Used  Substance and Sexual Activity  . Alcohol use: No    Comment: rare  . Drug use: No  . Sexual activity: Yes    Birth control/protection: None  Lifestyle  . Physical activity:    Days per week: Not on file    Minutes per session: Not on file  . Stress: Not on file  Relationships  . Social connections:    Talks on phone: Not on file    Gets together: Not on file    Attends religious service: Not on file    Active member of club or organization: Not on file    Attends meetings of clubs or organizations: Not on file    Relationship status: Not on file  . Intimate partner violence:    Fear of current or ex partner: Not on file    Emotionally abused: Not on file    Physically abused: Not on file    Forced sexual activity: Not on file  Other Topics Concern  . Not on file  Social History Narrative  . Not on file    Family History  Problem Relation Age of Onset  . Gestational diabetes Mother     The following portions of the patient's  history were reviewed and updated as appropriate: allergies, current medications, past family history, past medical history, past social history, past surgical history and problem list.  Review of Systems  ROS negative except as noted above. Information obtained from patient and mother.   Objective:   BP 125/79   Pulse (!) 109   Ht 5\' 6"  (1.676 m)   Wt 272 lb (123.4 kg)   LMP 12/06/2017 (Approximate)   BMI 43.90 kg/m   CONSTITUTIONAL: Well-developed, well-nourished female in no acute distress.   EXAM:TRANSABDOMINAL AND TRANSVAGINAL ULTRASOUND OF PELVIS/DOPPLER ULTRASOUND OF OVARIES (12/28/2017)  TECHNIQUE: Both transabdominal and transvaginal ultrasound examinations of the pelvis were performed. Transabdominal technique was performed for global imaging of the pelvis including uterus, ovaries, adnexal regions, and pelvic  cul-de-sac.  It was necessary to proceed with endovaginal exam following the transabdominal exam to visualize the uterus, endometrium, ovaries, and adnexal structures. Color and duplex Doppler ultrasound was utilized to evaluate blood flow to the ovaries.  COMPARISON:  None.  FINDINGS: Visualization of the adnexal structures is somewhat limited due to bowel gas.  Uterus  Measurements: 7.9 x 4.7 x 5.5 cm = volume: 106 mL. There is a hypoechoic smoothly marginated structure in the posterior right aspect of the uterine fundus which measures 2.6 x 2.9 x 3.0 cm.  Endometrium  Thickness: 1.0 mm.  No focal abnormality visualized.  Right ovary  Measurements: 3.3 x 1.9 x 2.4 cm = volume: 7.9 mL. Normal appearance/no adnexal mass.  Left ovary  Measurements: 2.7 x 1.9 x 2.0 cm = volume: 5.1 mL. Normal appearance/no adnexal mass.  Pulsed Doppler evaluation of both ovaries demonstrates normal low-resistance arterial and venous waveforms.  Other findings  No abnormal free fluid.  IMPRESSION: Probable posterior right-sided fundal fibroid measuring up to 3 cm in diameter. Otherwise normal appearing uterus with normal endometrial stripe.  Normal appearance of both ovaries. Normal ovarian vascularity. No adnexal masses. No free pelvic fluid.    Assessment:   1. Uterine leiomyoma, unspecified location   2. Pelvic pain  3. Menorrhagia with regular cycle  Plan:   Fibroid management options reviewed with patient and mother at length. Declines all hormonal methods at this time. Desires surgery for removal. Information given regarding myomectomy and UFE; see handouts and AVS.   Declines medication for management of menorrhagia at this time.   Reviewed red flag symptoms and when to call.   RTC for follow up with MD to discuss fibroid management options.    Diona Fanti, CNM [facility], [facility] 01/03/18 10:19 AM   A total of 20  minutes were spent face-to-face with the patient during this encounter and over half of that time dealt with counseling and coordination of care.

## 2018-01-03 NOTE — Patient Instructions (Addendum)
Uterine Fibroids Uterine fibroids are tissue masses (tumors) that can develop in the womb (uterus). They are also called leiomyomas. This type of tumor is not cancerous (benign) and does not spread to other parts of the body outside of the pelvic area, which is between the hip bones. Occasionally, fibroids may develop in the fallopian tubes, in the cervix, or on the support structures (ligaments) that surround the uterus. You can have one or many fibroids. Fibroids can vary in size, weight, and where they grow in the uterus. Some can become quite large. Most fibroids do not require medical treatment. What are the causes? A fibroid can develop when a single uterine cell keeps growing (replicating). Most cells in the human body have a control mechanism that keeps them from replicating without control. What are the signs or symptoms? Symptoms may include:  Heavy bleeding during your period.  Bleeding or spotting between periods.  Pelvic pain and pressure.  Bladder problems, such as needing to urinate more often (urinary frequency) or urgently.  Inability to reproduce offspring (infertility).  Miscarriages.  How is this diagnosed? Uterine fibroids are diagnosed through a physical exam. Your health care provider may feel the lumpy tumors during a pelvic exam. Ultrasonography and an MRI may be done to determine the size, location, and number of fibroids. How is this treated? Treatment may include:  Watchful waiting. This involves getting the fibroid checked by your health care provider to see if it grows or shrinks. Follow your health care provider's recommendations for how often to have this checked.  Hormone medicines. These can be taken by mouth or given through an intrauterine device (IUD).  Surgery. ? Removing the fibroids (myomectomy) or the uterus (hysterectomy). ? Removing blood supply to the fibroids (uterine artery embolization).  If fibroids interfere with your fertility and you  want to become pregnant, your health care provider may recommend having the fibroids removed. Follow these instructions at home:  Keep all follow-up visits as directed by your health care provider. This is important.  Take over-the-counter and prescription medicines only as told by your health care provider. ? If you were prescribed a hormone treatment, take the hormone medicines exactly as directed.  Ask your health care provider about taking iron pills and increasing the amount of dark green, leafy vegetables in your diet. These actions can help to boost your blood iron levels, which may be affected by heavy menstrual bleeding.  Pay close attention to your period and tell your health care provider about any changes, such as: ? Increased blood flow that requires you to use more pads or tampons than usual per month. ? A change in the number of days that your period lasts per month. ? A change in symptoms that are associated with your period, such as abdominal cramping or back pain. Contact a health care provider if:  You have pelvic pain, back pain, or abdominal cramps that cannot be controlled with medicines.  You have an increase in bleeding between and during periods.  You soak tampons or pads in a half hour or less.  You feel lightheaded, extra tired, or weak. Get help right away if:  You faint.  You have a sudden increase in pelvic pain. This information is not intended to replace advice given to you by your health care provider. Make sure you discuss any questions you have with your health care provider. Document Released: 01/10/2000 Document Revised: 09/12/2015 Document Reviewed: 07/11/2013 Elsevier Interactive Patient Education  [employer]. Pelvic  Pain, Female Pelvic pain is pain in your lower belly (abdomen), below your belly button and between your hips. The pain may start suddenly (acute), keep coming back (recurring), or last a long time (chronic). Pelvic pain that  lasts longer than six months is considered chronic. There are many causes of pelvic pain. Sometimes the cause of your pelvic pain is not known. Follow these instructions at home:  Take over-the-counter and prescription medicines only as told by your doctor.  Rest as told by your doctor.  Do not have sex it if hurts.  Keep a journal of your pelvic pain. Write down: ? When the pain started. ? Where the pain is located. ? What seems to make the pain better or worse, such as food or your menstrual cycle. ? Any symptoms you have along with the pain.  Keep all follow-up visits as told by your doctor. This is important. Contact a doctor if:  Medicine does not help your pain.  Your pain comes back.  You have new symptoms.  You have unusual vaginal discharge or bleeding.  You have a fever or chills.  You are having a hard time pooping (constipation).  You have blood in your pee (urine) or poop (stool).  Your pee smells bad.  You feel weak or lightheaded. Get help right away if:  You have sudden pain that is very bad.  Your pain continues to get worse.  You have very bad pain and also have any of the following symptoms: ? A fever. ? Feeling stick to your stomach (nausea). ? Throwing up (vomiting). ? Being very sweaty.  You pass out (lose consciousness). This information is not intended to replace advice given to you by your health care provider. Make sure you discuss any questions you have with your health care provider. Document Released: 07/01/2007 Document Revised: 02/06/2015 Document Reviewed: 11/02/2014 Elsevier Interactive Patient Education  2018 [employer].  Myomectomy Myomectomy is surgery to remove a noncancerous tumor (myoma) from the uterus. Myomas are tumors made up of fibrous tissue. They are often called fibroid tumors. Fibroid tumors can range from the size of a pea to the size of a grapefruit. In a myomectomy, the fibroid tumor is removed without removing  the uterus. Because these tumors are rarely cancerous, this surgery is usually done only if the tumor is growing or causing symptoms such as pain, pressure, bleeding, or pain with intercourse. LET [facility] CARE PROVIDER KNOW ABOUT:  Any allergies you have.  All medicines you are taking, including vitamins, herbs, eye drops, creams, and over-the-counter medicines.  Previous problems you or members of your family have had with the use of anesthetics.  Any blood disorders you have.  Previous surgeries you have had.  Medical conditions you have. RISKS AND COMPLICATIONS Generally, this is a safe procedure. However, as with any procedure, complications can occur. Possible complications include:  Excessive bleeding.  Infection.  Injury to nearby organs.  Blood clots in the legs, chest, or brain.  Scar tissue on other organs and in the pelvis. This may require another surgery to remove the scar tissue.  BEFORE THE PROCEDURE  Ask your health care provider about changing or stopping your regular medicines. Avoid taking aspirin or blood thinners as directed by your health care provider.  Do not  eat or drink anything after midnight on the night before surgery.  If you smoke, do not  smoke for 2 weeks before the surgery.  Do not  drink alcohol the day  before the surgery.  Arrange for someone to drive you home after the procedure or after your hospital stay. Also arrange for someone to help you with activities during your recovery. PROCEDURE You will be given medicine to make you sleep through the procedure (general anesthetic). Any of the following methods may be used to perform a myomectomy:  Small monitors will be put on your body. They are used to check your heart, blood pressure, and oxygen level.  An IV access tube will be put into one of your veins. Medicine will be able to flow directly into your body through this IV tube.  You might be given a medicine to help you relax  (sedative).  You will be given a medicine to make you sleep (general anesthetic). A breathing tube will be placed into your lungs during the procedure.  A thin, flexible tube (catheter) will be inserted into your bladder to collect urine.  Any of the following methods may be used to perform a myomectomy: ? Hysteroscopic myomectomy-This method may be used when the fibroid tumor is inside the cavity of the uterus. A long, thin tube that is like a telescope (hysteroscope) is inserted inside the uterus. A saline solution is put into your uterus. This expands the uterus and allows the surgeon to see the fibroids. Tools are passed through the hysteroscope to remove the fibroid tumor in pieces. ? Laparoscopic myomectomy-A few small cuts (incisions) are made in the lower abdomen. A thin, lighted tube with a tiny camera on the end (laparoscope) is inserted through one of the incisions. This gives the surgeon a good view of the area. The fibroid tumor is removed through the other incisions. The incisions are then closed with stitches (sutures) or staples. ? Abdominal myomectomy-This method is used when the fibroid tumor cannot be removed with a hysteroscope or laparoscope. The surgery is performed through a larger surgical incision in the abdomen. The fibroid tumor is removed through this incision. The incision is closed with sutures or staples.  What to expect after the procedure  If you had a laparoscopic or hysteroscopic myomectomy, you may be able to go home the same day, or you may need to stay in the hospital overnight.  If you had an abdominal myomectomy, you may need to stay in the hospital for a few days.  Your IV access tube and catheter will be removed in 1-2 days.  You may be given medicine for pain or to help you sleep.  You may be given an antibiotic medicine, if needed. This information is not intended to replace advice given to you by your health care provider. Make sure you discuss any  questions you have with your health care provider. Document Released: 11/09/2006 Document Revised: 06/20/2015 Document Reviewed: 08/24/2012 Elsevier Interactive Patient Education  2017 [place]. Uterine Artery Embolization for Fibroids, Care After Refer to this sheet in the next few weeks. These instructions provide you with information on caring for yourself after your procedure. Your health care provider may also give you more specific instructions. Your treatment has been planned according to current medical practices, but problems sometimes occur. Call your health care provider if you have any problems or questions after your procedure. What can I expect after the procedure? After your procedure, it is typical to have cramping in the pelvis. You will be given pain medicine to control it. Follow these instructions at home:  Only take over-the-counter or prescription medicines for pain, discomfort, or fever as directed by  your health care provider.  Do not take aspirin. It can cause bleeding.  Follow your health care provider's advice regarding medicines given to you, diet, activity, and when to begin sexual activity.  See your health care provider for follow-up care as directed. Contact a health care provider if:  You have a fever.  You have redness, swelling, and pain around your incision site.  You have pus draining from your incision.  You have a rash. Get help right away if:  You have bleeding from your incision site.  You have difficulty breathing.  You have chest pain.  You have severe abdominal pain.  You have leg pain.  You become dizzy and faint. This information is not intended to replace advice given to you by your health care provider. Make sure you discuss any questions you have with your health care provider. Document Released: 11/02/2012 Document Revised: 06/20/2015 Document Reviewed: 07/28/2012 Elsevier Interactive Patient Education  [employer].

## 2019-01-23 ENCOUNTER — Other Ambulatory Visit: Payer: Self-pay

## 2019-01-23 ENCOUNTER — Encounter: Payer: Self-pay | Admitting: Family Medicine

## 2019-01-23 ENCOUNTER — Ambulatory Visit (INDEPENDENT_AMBULATORY_CARE_PROVIDER_SITE_OTHER): Payer: Self-pay | Admitting: Family Medicine

## 2019-01-23 VITALS — BP 125/83 | HR 90 | Temp 99.1°F | Ht 66.0 in | Wt 264.0 lb

## 2019-01-23 DIAGNOSIS — R200 Anesthesia of skin: Secondary | ICD-10-CM

## 2019-01-23 DIAGNOSIS — L309 Dermatitis, unspecified: Secondary | ICD-10-CM

## 2019-01-23 DIAGNOSIS — D259 Leiomyoma of uterus, unspecified: Secondary | ICD-10-CM

## 2019-01-23 NOTE — Progress Notes (Signed)
BP 125/83   Pulse 90   Temp 99.1 F (37.3 C) (Oral)   Ht 5\' 6"  (1.676 m)   Wt 264 lb (119.7 kg)   SpO2 98%   BMI 42.61 kg/m    Subjective:    Patient ID: Mackenzie Kirby, female    DOB: 10-18-1990, 28 y.o.   MRN: [id number]  HPI: Mackenzie Kirby is a 28 y.o. female  Chief Complaint  Patient presents with  . Allergies    pt states she is allergic to grease at work that makes her hands rash and itchy.. work note    Patient presenting today requesting some work notes for some issues she's been having. 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 Hands itch and get rashes any time she's around the substance, regardless of if she's got gloves on. Needs a note saying she can't work around these areas of the [place].  Fibroid causing increased urinary frequency and pain, unable to have it removed surgically as planned at this time due to insurance issues. Needing a letter saying she should be allowed extra bathroom breaks because of this.   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 Has not been trying anything OTC for this to this point. Denies direct injury, swelling, color change.   Relevant past medical, surgical, family and social history reviewed and updated as indicated. Interim medical history since our last visit reviewed. Allergies and medications reviewed and updated.  Review of Systems  Per HPI unless specifically indicated above     Objective:    BP 125/83   Pulse 90   Temp 99.1 F (37.3 C) (Oral)   Ht 5\' 6"  (1.676 m)   Wt 264 lb (119.7 kg)   SpO2 98%   BMI 42.61 kg/m   Wt Readings from Last 3 Encounters:  01/23/19 264 lb (119.7 kg)  01/03/18 272 lb (123.4 kg)  12/28/17 271 lb 2.7 oz (123 kg)    Physical Exam Vitals and nursing note reviewed.  Constitutional:      Appearance: Normal appearance. She is not ill-appearing.  HENT:     Head:  Atraumatic.  Eyes:     Extraocular Movements: Extraocular movements intact.     Conjunctiva/sclera: Conjunctivae normal.  Cardiovascular:     Rate and Rhythm: Normal rate and regular rhythm.     Heart sounds: Normal heart sounds.  Pulmonary:     Effort: Pulmonary effort is normal.     Breath sounds: Normal breath sounds.  Musculoskeletal:        General: No swelling, tenderness or deformity. Normal range of motion.     Cervical back: Normal range of motion and neck supple.  Skin:    General: Skin is warm and dry.  Neurological:     Mental Status: She is alert and oriented to person, place, and time.     Sensory: No sensory deficit.     Motor: No weakness.  Psychiatric:        Mood and Affect: Mood normal.        Thought Content: Thought content normal.        Judgment: Judgment normal.     Results for orders placed or performed during the hospital encounter of 12/28/17  Lipase, blood  Result Value Ref Range   Lipase 37 11 - 51 U/L  Comprehensive metabolic panel  Result Value Ref Range  Sodium 137 135 - 145 mmol/L   Potassium 4.1 3.5 - 5.1 mmol/L   Chloride 104 98 - 111 mmol/L   CO2 22 22 - 32 mmol/L   Glucose, Bld 114 (H) 70 - 99 mg/dL   BUN 9 6 - 20 mg/dL   Creatinine, Ser 0.63 0.44 - 1.00 mg/dL   Calcium 9.1 8.9 - 10.3 mg/dL   Total Protein 7.0 6.5 - 8.1 g/dL   Albumin 4.1 3.5 - 5.0 g/dL   AST 28 15 - 41 U/L   ALT 37 0 - 44 U/L   Alkaline Phosphatase 58 38 - 126 U/L   Total Bilirubin 0.5 0.3 - 1.2 mg/dL   GFR calc non Af Amer >60 >60 mL/min   GFR calc Af Amer >60 >60 mL/min   Anion gap 11 5 - 15  CBC  Result Value Ref Range   WBC 9.2 4.0 - 10.5 K/uL   RBC 5.16 (H) 3.87 - 5.11 MIL/uL   Hemoglobin 14.3 12.0 - 15.0 g/dL   HCT 42.7 36.0 - 46.0 %   MCV 82.8 80.0 - 100.0 fL   MCH 27.7 26.0 - 34.0 pg   MCHC 33.5 30.0 - 36.0 g/dL   RDW 12.7 11.5 - 15.5 %   Platelets 362 150 - 400 K/uL   nRBC 0.0 0.0 - 0.2 %  Urinalysis, Complete w Microscopic  Result Value Ref  Range   Color, Urine YELLOW (A) YELLOW   APPearance CLEAR (A) CLEAR   Specific Gravity, Urine 1.025 1.005 - 1.030   pH 5.0 5.0 - 8.0   Glucose, UA NEGATIVE NEGATIVE mg/dL   Hgb urine dipstick NEGATIVE NEGATIVE   Bilirubin Urine NEGATIVE NEGATIVE   Ketones, ur NEGATIVE NEGATIVE mg/dL   Protein, ur 30 (A) NEGATIVE mg/dL   Nitrite NEGATIVE NEGATIVE   Leukocytes, UA NEGATIVE NEGATIVE   RBC / HPF 0-5 0 - 5 RBC/hpf   WBC, UA 0-5 0 - 5 WBC/hpf   Bacteria, UA RARE (A) NONE SEEN   Squamous Epithelial / LPF 6-10 0 - 5   Mucus PRESENT    Ca Oxalate Crys, UA PRESENT   Pregnancy, urine POC  Result Value Ref Range   Preg Test, Ur NEGATIVE NEGATIVE      Assessment & Plan:   Problem List Items Addressed This Visit      Genitourinary   Uterine leiomyoma    Awaiting surgical procedure once insurance goes back into effect. In meantime, will provide note allowing more restroom breaks to accommodate for this       Other Visit Diagnoses    Hand dermatitis    -  Primary   Not currently flared. Will provide note stating she can't work in contact with specific grease due to contact allergy   Numbness of right hand       suspect from tendonitis/inflammation at elbow from repetitive movements. Stretches, massage, NSAIDs reviewed       Follow up plan: Return if symptoms worsen or fail to improve.

## 2019-01-26 NOTE — Assessment & Plan Note (Signed)
Awaiting surgical procedure once insurance goes back into effect. In meantime, will provide note allowing more restroom breaks to accommodate for this

## 2019-10-13 ENCOUNTER — Encounter: Payer: Self-pay | Admitting: Emergency Medicine

## 2019-10-13 ENCOUNTER — Ambulatory Visit
Admission: EM | Admit: 2019-10-13 | Discharge: 2019-10-13 | Disposition: A | Discharge status: Home / Self Care | Payer: PRIVATE HEALTH INSURANCE | Attending: Internal Medicine | Admitting: Internal Medicine

## 2019-10-13 ENCOUNTER — Other Ambulatory Visit: Payer: Self-pay

## 2019-10-13 DIAGNOSIS — H9202 Otalgia, left ear: Secondary | ICD-10-CM | POA: Diagnosis not present

## 2019-10-13 DIAGNOSIS — H6502 Acute serous otitis media, left ear: Secondary | ICD-10-CM | POA: Diagnosis not present

## 2019-10-13 DIAGNOSIS — J029 Acute pharyngitis, unspecified: Secondary | ICD-10-CM

## 2019-10-13 DIAGNOSIS — H60502 Unspecified acute noninfective otitis externa, left ear: Secondary | ICD-10-CM | POA: Diagnosis not present

## 2019-10-13 MED ORDER — FEXOFENADINE HCL 180 MG PO TABS: 180.0000 mg | ORAL_TABLET | Freq: Every day | ORAL | 0 refills | Status: DC

## 2019-10-13 MED ORDER — NEOMYCIN-POLYMYXIN-HC 3.5-10000-1 OT SUSP: 4.0000 [drp] | Freq: Three times a day (TID) | OTIC | 0 refills | Status: AC

## 2019-10-13 MED ORDER — FLUTICASONE PROPIONATE 50 MCG/ACT NA SUSP: 2.0000 | Freq: Every day | NASAL | 0 refills | Status: DC

## 2019-10-13 NOTE — ED Triage Notes (Signed)
Patient c/o left ear pain that started Monday.  Patient denies fevers.

## 2019-10-13 NOTE — Discharge Instructions (Signed)
Come back if you get worse

## 2019-10-13 NOTE — ED Provider Notes (Signed)
[facility]    CSN: [id number] Arrival date & time: 10/13/19  0915      History   Chief Complaint Chief Complaint  Patient presents with  . Otalgia    HPI Mackenzie Kirby is a 29 y.o. female who presents with onset of L ear pain and ST  5 days ago. Denies a fever, chills, sweats or cough. Is able to eat. Her neck glands feel a little tender. Noticed she cant clean her L ear canal because it hurts. Denies trouble hearing.     Past Medical History:  Diagnosis Date  . Allergic rhinitis   . Fibroid   . Fibroids     Patient Active Problem List   Diagnosis Date Noted  . Menorrhagia with regular cycle 01/03/2018  . Uterine leiomyoma 01/03/2018  . Obesity 10/04/2017  . IFG (impaired fasting glucose) 06/08/2017    Past Surgical History:  Procedure Laterality Date  . WISDOM TOOTH EXTRACTION      OB History    Gravida  0   Para  0   Term  0   Preterm  0   AB  0   Living  0     SAB  0   TAB  0   Ectopic  0   Multiple  0   Live Births  0            Home Medications    Prior to Admission medications   Not on File    Family History Family History  Problem Relation Age of Onset  . Gestational diabetes Mother     Social History Social History   Tobacco Use  . Smoking status: Former Smoker    Packs/day: 0.25    Types: Cigarettes  . Smokeless tobacco: Never Used  . Tobacco comment: pt states she stopped smoking about 4-5 months ago  Vaping Use  . Vaping Use: Every day  Substance Use Topics  . Alcohol use: No    Comment: rare  . Drug use: No     Allergies   Patient has no known allergies.   Review of Systems Review of Systems  Constitutional: Negative for activity change, appetite change, chills, diaphoresis, fatigue and fever.  HENT: Positive for ear pain, postnasal drip, rhinorrhea, sneezing and sore throat. Negative for congestion, dental problem, ear discharge and trouble swallowing.   Eyes: Negative for  discharge.  Respiratory: Negative for cough and shortness of breath.   Musculoskeletal: Negative for myalgias.  Skin: Negative for rash.  Allergic/Immunologic: Positive for environmental allergies.  Neurological: Negative for headaches.  Hematological: Positive for adenopathy.     Physical Exam Triage Vital Signs ED Triage Vitals  Enc Vitals Group     BP 10/13/19 0931 (!) 142/99     Pulse Rate 10/13/19 0931 94     Resp 10/13/19 0931 14     Temp 10/13/19 0931 98.7 F (37.1 C)     Temp Source 10/13/19 0931 Oral     SpO2 10/13/19 0931 99 %     Weight 10/13/19 0929 250 lb (113.4 kg)     Height 10/13/19 0929 5\' 6"  (1.676 m)     Head Circumference --      Peak Flow --      Pain Score 10/13/19 0928 5     Pain Loc --      Pain Edu? --      Excl. in [place]? --    No data found.  Updated Vital Signs BP Marland Kitchen)  142/99 (BP Location: Right Arm)   Pulse 94   Temp 98.7 F (37.1 C) (Oral)   Resp 14   Ht 5\' 6"  (1.676 m)   Wt 250 lb (113.4 kg)   LMP 09/19/2019 (Approximate)   SpO2 99%   BMI 40.35 kg/m   Visual Acuity Right Eye Distance:   Left Eye Distance:   Bilateral Distance:    Right Eye Near:   Left Eye Near:    Bilateral Near:     Physical Exam Vitals and nursing note reviewed.  Constitutional:      General: She is not in acute distress.    Appearance: She is obese. She is not toxic-appearing.  HENT:     Head: Normocephalic.     Right Ear: Tympanic membrane, ear canal and external ear normal.     Ears:     Comments: L EAR- canal is erythematous, but not swollen. Tender to touch, but not when moving the external ear. TM is dull and has fluid, there is no erythema.     Nose: Rhinorrhea present.     Mouth/Throat:     Mouth: Mucous membranes are moist.     Pharynx: Oropharynx is clear. No oropharyngeal exudate or posterior oropharyngeal erythema.     Comments: Uvula is mid line Eyes:     General: No scleral icterus.    Conjunctiva/sclera: Conjunctivae normal.  Neck:      Comments: Her upper anterior chain nodes are slightly tender and only there slight enlargement on the L.  Cardiovascular:     Rate and Rhythm: Normal rate and regular rhythm.  Pulmonary:     Effort: Pulmonary effort is normal.     Breath sounds: Normal breath sounds.  Musculoskeletal:        General: Normal range of motion.     Cervical back: Neck supple.  Skin:    General: Skin is warm and dry.     Findings: No rash.  Neurological:     Mental Status: She is alert and oriented to person, place, and time.     Gait: Gait normal.  Psychiatric:        Mood and Affect: Mood normal.        Behavior: Behavior normal.        Thought Content: Thought content normal.        Judgment: Judgment normal.    UC Treatments / Results  Labs (all labs ordered are listed, but only abnormal results are displayed) Labs Reviewed - No data to display  EKG   Radiology No results found.  Procedures Procedures (including critical care time)  Medications Ordered in UC Medications - No data to display  Initial Impression / Assessment and Plan / UC Course  I have reviewed the triage vital signs and the nursing notes. Has SOM and EOM. Her pharyngitis seems to be from PND.  She was placed on Cortisporin otic gtts, Allegra and Flonase as noted.   Final Clinical Impressions(s) / UC Diagnoses   Final diagnoses:  None   Discharge Instructions   None    ED Prescriptions    None     PDMP not reviewed this encounter.   Shelby Mattocks, [place] 10/13/19 [phone]

## 2020-02-19 IMAGING — US US TRANSVAGINAL NON-OB
1 series · 13 of 25 positions shown · non-contrast
Comparison: None.

CLINICAL DATA: Pelvic pain since 12

EXAM:
TRANSABDOMINAL AND TRANSVAGINAL ULTRASOUND OF PELVIS
DOPPLER ULTRASOUND OF OVARIES
TECHNIQUE: Both transabdominal and transvaginal ultrasound examinations of the
pelvis were performed. Transabdominal technique was performed for
global imaging of the pelvis including uterus, ovaries, adnexal
regions, and pelvic cul-de-sac.
It was necessary to proceed with endovaginal exam following the
transabdominal exam to visualize the uterus, endometrium, ovaries,
and adnexal structures.. Color and duplex Doppler ultrasound was
utilized to evaluate blood flow to the ovaries.

[Series 1: us transvaginal non-ob · 0.20mm/px · 13 of 73 slices shown]
[im 1/73]
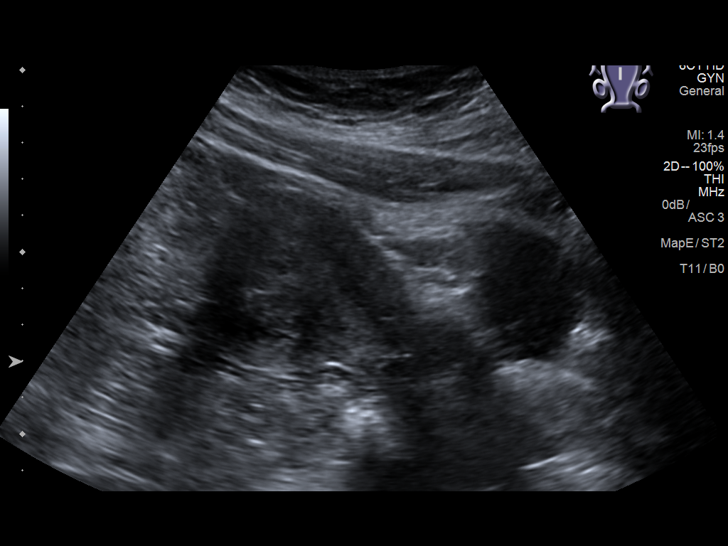
[im 7/73]
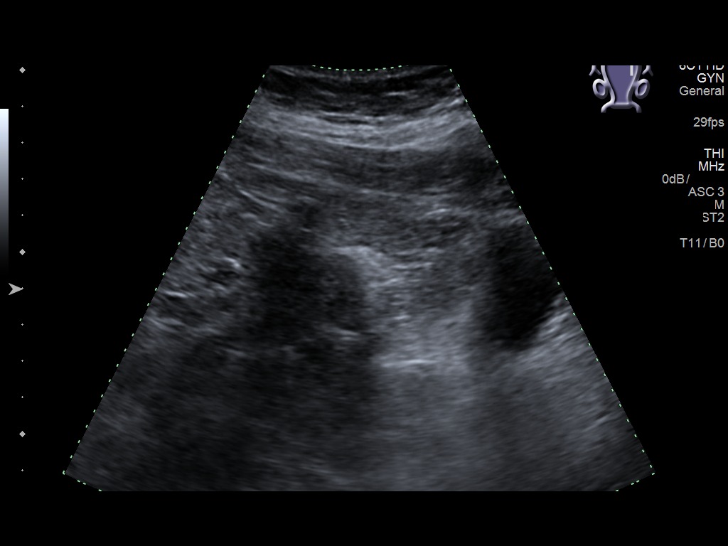
[im 13/73]
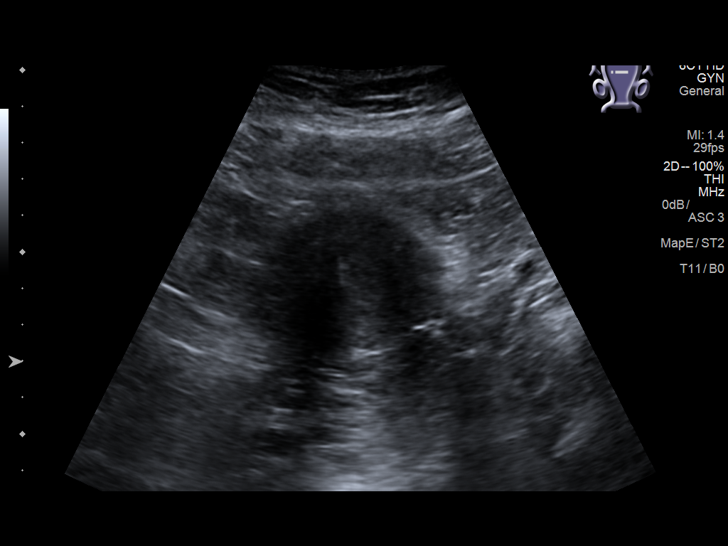
[im 19/73]
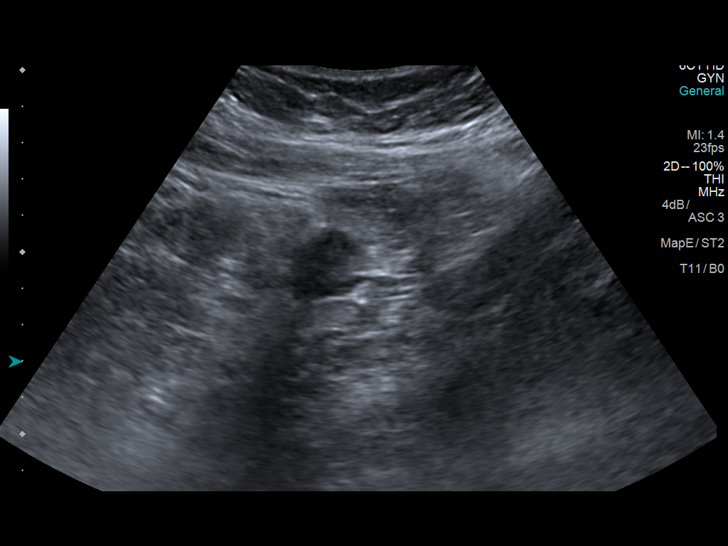
[im 25/73]
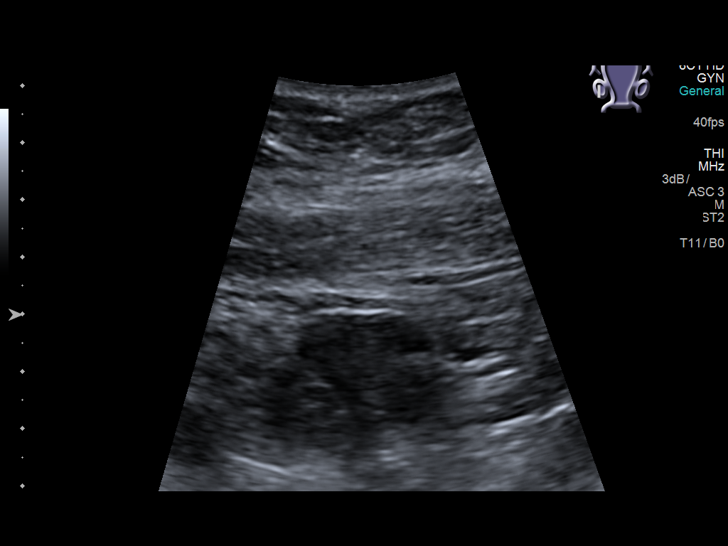
[im 31/73]
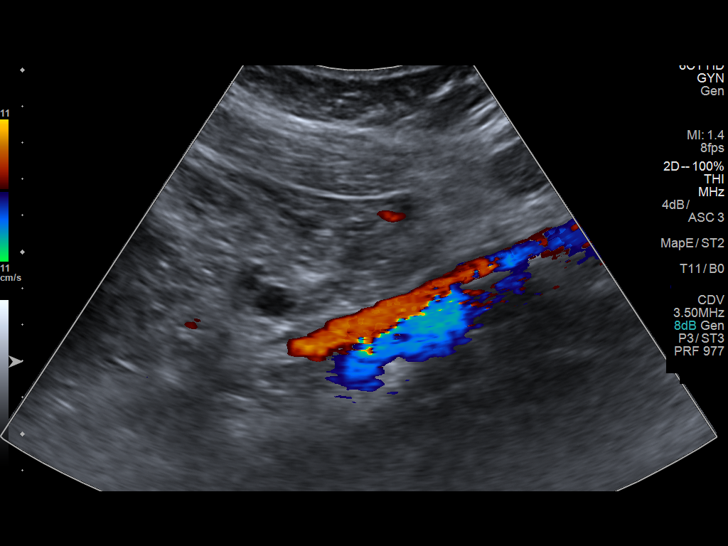
[im 37/73]
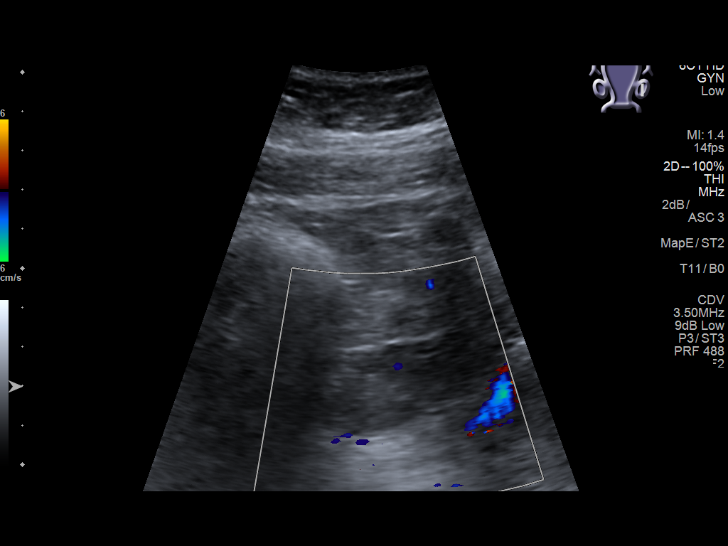
[im 43/73]
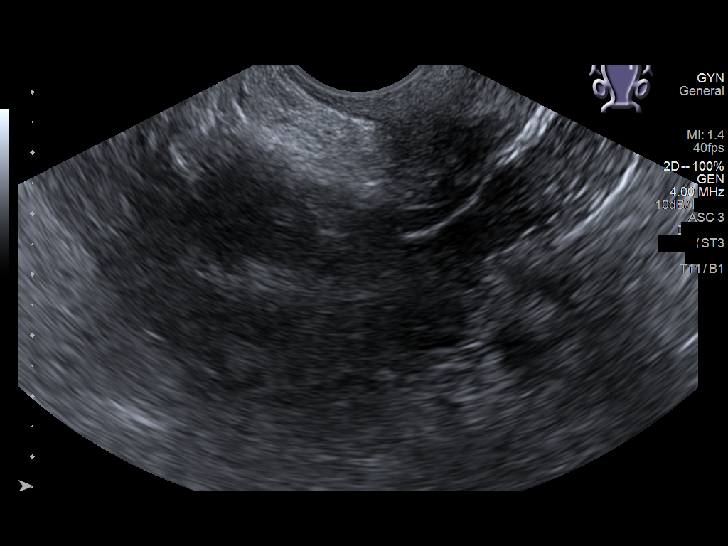
[im 49/73]
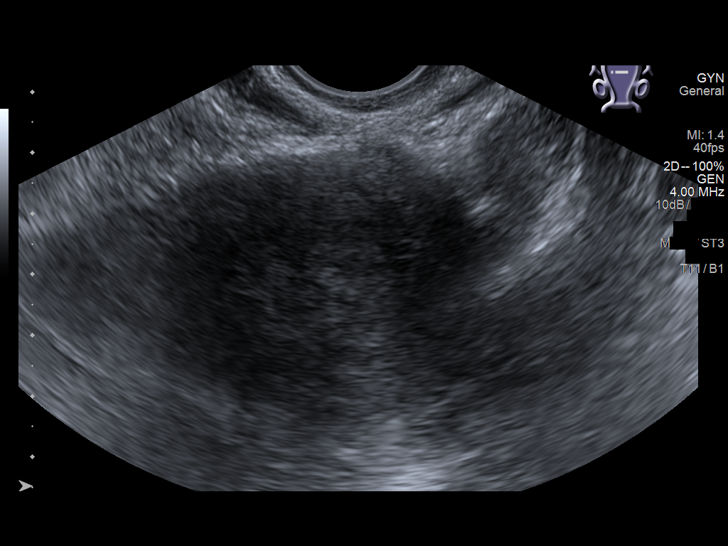
[im 55/73]
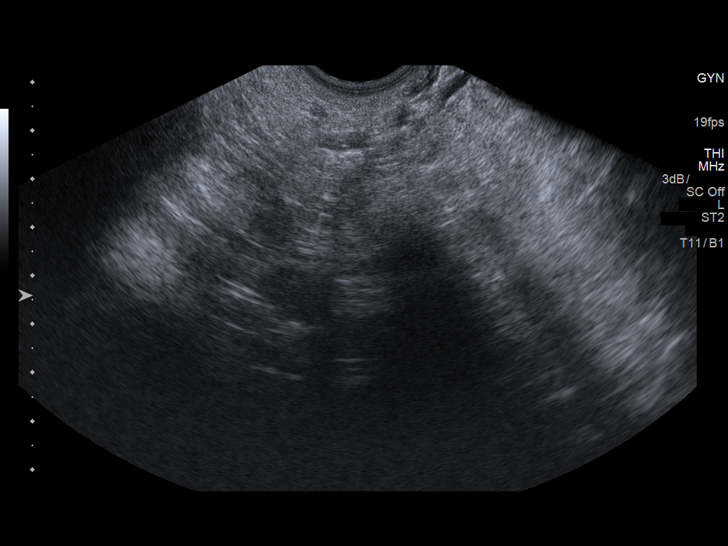
[im 61/73]
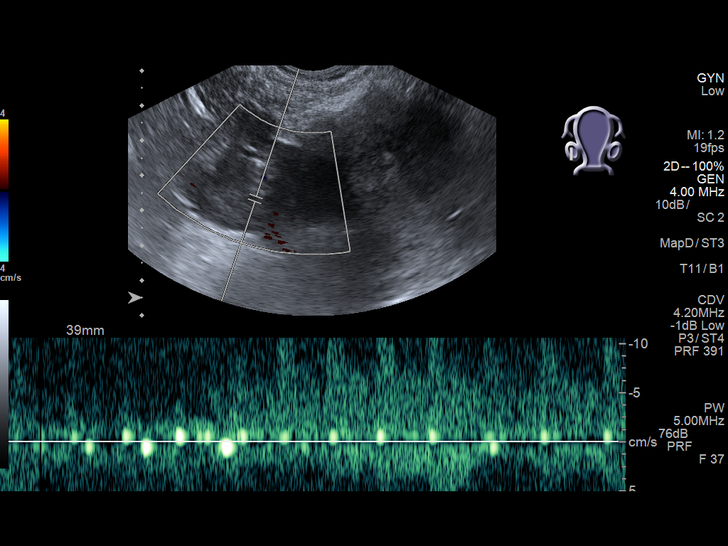
[im 67/73]
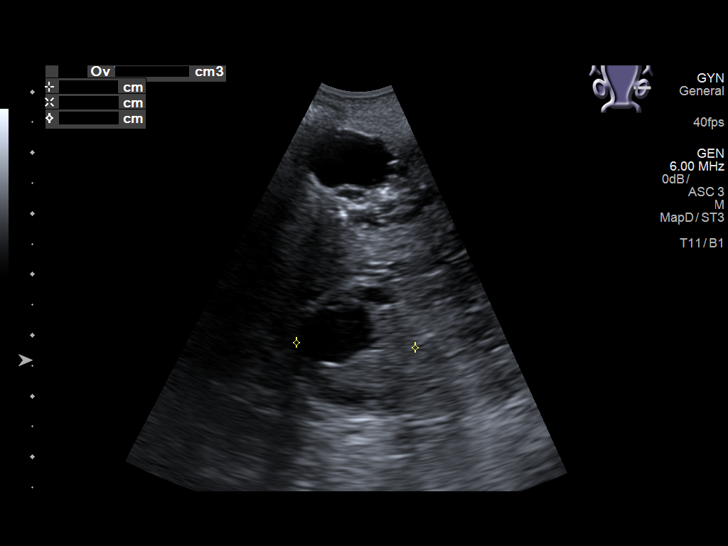
[im 73/73]
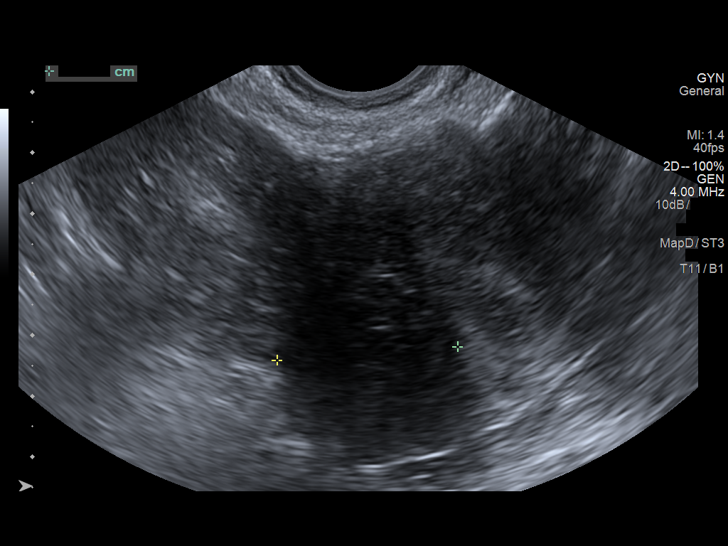

[13 of 25 positions shown; findings below may reference images not displayed]

FINDINGS: Visualization of the adnexal structures is somewhat limited due to
bowel gas.

Uterus

Measurements: 7.9 x 4.7 x 5.5 cm = volume: 106 mL. There is a
hypoechoic smoothly marginated structure in the posterior right
aspect of the uterine fundus which measures 2.6 x 2.9 x 3.0 cm.

Endometrium

Thickness: 1.0 mm.  No focal abnormality visualized.

Right ovary

Measurements: 3.3 x 1.9 x 2.4 cm = volume: 7.9 mL. Normal
appearance/no adnexal mass.

Left ovary

Measurements: 2.7 x 1.9 x 2.0 cm = volume: 5.1 mL. Normal
appearance/no adnexal mass.

Pulsed Doppler evaluation of both ovaries demonstrates normal
low-resistance arterial and venous waveforms.

Other findings

No abnormal free fluid.
IMPRESSION: Probable posterior right-sided fundal fibroid measuring up to 3 cm
in diameter. Otherwise normal appearing uterus with normal
endometrial stripe.

Normal appearance of both ovaries. Normal ovarian vascularity. No
adnexal masses. No free pelvic fluid.

## 2020-05-23 ENCOUNTER — Telehealth: Payer: Self-pay

## 2020-05-23 NOTE — Telephone Encounter (Signed)
Lvm for pt to call back. Pt has not been seen here since 2020 not sure what she means in regards to a work note.   Copied from [place] [id number]. Topic: General - Other >> May 23, 2020 11:32 AM Pawlus, Brayton Layman A wrote: Reason for CRM: Pt asked for an updated doctors note for work, pt asked for a call when the letter was completed.

## 2020-06-20 ENCOUNTER — Ambulatory Visit (INDEPENDENT_AMBULATORY_CARE_PROVIDER_SITE_OTHER): Payer: PRIVATE HEALTH INSURANCE | Admitting: Nurse Practitioner

## 2020-06-20 ENCOUNTER — Other Ambulatory Visit: Payer: Self-pay

## 2020-06-20 ENCOUNTER — Encounter: Payer: Self-pay | Admitting: Nurse Practitioner

## 2020-06-20 VITALS — BP 102/78 | HR 90 | Temp 99.1°F | Wt 240.6 lb

## 2020-06-20 DIAGNOSIS — R350 Frequency of micturition: Secondary | ICD-10-CM | POA: Diagnosis not present

## 2020-06-20 NOTE — Progress Notes (Signed)
BP 102/78   Pulse 90   Temp 99.1 F (37.3 C) (Oral)   Wt 240 lb 9.6 oz (109.1 kg)   SpO2 99%   BMI 38.83 kg/m    Subjective:    Patient ID: Mackenzie Kirby, female    DOB: January 31, 1990, 30 y.o.   MRN: [id number]  HPI: Mackenzie Kirby is a 30 y.o. female  Chief Complaint  Patient presents with  . renew doctor note    Patient needs to have her doctors note renewed for frequent urination    Patient presents to clinic due to needing a doctors note that says she has frequent urination.  Patient has a uterine fibroid that pushes on her bladder and makes her go frequently.  Patient's work needs a renewed [employer].  Denies other concerns at visit today.    Relevant past medical, surgical, family and social history reviewed and updated as indicated. Interim medical history since our last visit reviewed. Allergies and medications reviewed and updated.  Review of Systems  Genitourinary: Positive for frequency.    Per HPI unless specifically indicated above     Objective:    BP 102/78   Pulse 90   Temp 99.1 F (37.3 C) (Oral)   Wt 240 lb 9.6 oz (109.1 kg)   SpO2 99%   BMI 38.83 kg/m   Wt Readings from Last 3 Encounters:  06/20/20 240 lb 9.6 oz (109.1 kg)  10/13/19 250 lb (113.4 kg)  01/23/19 264 lb (119.7 kg)    Physical Exam Vitals and nursing note reviewed.  Constitutional:      General: She is not in acute distress.    Appearance: Normal appearance. She is normal weight. She is not ill-appearing, toxic-appearing or diaphoretic.  HENT:     Head: Normocephalic.     Right Ear: External ear normal.     Left Ear: External ear normal.     Nose: Nose normal.     Mouth/Throat:     Mouth: Mucous membranes are moist.     Pharynx: Oropharynx is clear.  Eyes:     General:        Right eye: No discharge.        Left eye: No discharge.     Extraocular Movements: Extraocular movements intact.     Conjunctiva/sclera: Conjunctivae normal.     Pupils: Pupils are equal, round,  and reactive to light.  Cardiovascular:     Rate and Rhythm: Normal rate and regular rhythm.     Heart sounds: No murmur heard.   Pulmonary:     Effort: Pulmonary effort is normal. No respiratory distress.     Breath sounds: Normal breath sounds. No wheezing or rales.  Musculoskeletal:     Cervical back: Normal range of motion and neck supple.  Skin:    General: Skin is warm and dry.     Capillary Refill: Capillary refill takes less than 2 seconds.  Neurological:     General: No focal deficit present.     Mental Status: She is alert and oriented to person, place, and time. Mental status is at baseline.  Psychiatric:        Mood and Affect: Mood normal.        Behavior: Behavior normal.        Thought Content: Thought content normal.        Judgment: Judgment normal.     Results for orders placed or performed during the hospital encounter of 12/28/17  Lipase, blood  Result  Value Ref Range   Lipase 37 11 - 51 U/L  Comprehensive metabolic panel  Result Value Ref Range   Sodium 137 135 - 145 mmol/L   Potassium 4.1 3.5 - 5.1 mmol/L   Chloride 104 98 - 111 mmol/L   CO2 22 22 - 32 mmol/L   Glucose, Bld 114 (H) 70 - 99 mg/dL   BUN 9 6 - 20 mg/dL   Creatinine, Ser 0.63 0.44 - 1.00 mg/dL   Calcium 9.1 8.9 - 10.3 mg/dL   Total Protein 7.0 6.5 - 8.1 g/dL   Albumin 4.1 3.5 - 5.0 g/dL   AST 28 15 - 41 U/L   ALT 37 0 - 44 U/L   Alkaline Phosphatase 58 38 - 126 U/L   Total Bilirubin 0.5 0.3 - 1.2 mg/dL   GFR calc non Af Amer >60 >60 mL/min   GFR calc Af Amer >60 >60 mL/min   Anion gap 11 5 - 15  CBC  Result Value Ref Range   WBC 9.2 4.0 - 10.5 K/uL   RBC 5.16 (H) 3.87 - 5.11 MIL/uL   Hemoglobin 14.3 12.0 - 15.0 g/dL   HCT 42.7 36.0 - 46.0 %   MCV 82.8 80.0 - 100.0 fL   MCH 27.7 26.0 - 34.0 pg   MCHC 33.5 30.0 - 36.0 g/dL   RDW 12.7 11.5 - 15.5 %   Platelets 362 150 - 400 K/uL   nRBC 0.0 0.0 - 0.2 %  Urinalysis, Complete w Microscopic  Result Value Ref Range   Color,  Urine YELLOW (A) YELLOW   APPearance CLEAR (A) CLEAR   Specific Gravity, Urine 1.025 1.005 - 1.030   pH 5.0 5.0 - 8.0   Glucose, UA NEGATIVE NEGATIVE mg/dL   Hgb urine dipstick NEGATIVE NEGATIVE   Bilirubin Urine NEGATIVE NEGATIVE   Ketones, ur NEGATIVE NEGATIVE mg/dL   Protein, ur 30 (A) NEGATIVE mg/dL   Nitrite NEGATIVE NEGATIVE   Leukocytes, UA NEGATIVE NEGATIVE   RBC / HPF 0-5 0 - 5 RBC/hpf   WBC, UA 0-5 0 - 5 WBC/hpf   Bacteria, UA RARE (A) NONE SEEN   Squamous Epithelial / LPF 6-10 0 - 5   Mucus PRESENT    Ca Oxalate Crys, UA PRESENT   Pregnancy, urine POC  Result Value Ref Range   Preg Test, Ur NEGATIVE NEGATIVE      Assessment & Plan:   Problem List Items Addressed This Visit   None   Visit Diagnoses    Frequency of urination    -  Primary   Letter written for patient's work to states she has urinary frequency.        Follow up plan: Return in about 2 months (around 08/20/2020) for Physical and Fasting labs.  A total of 20 minutes were spent on this encounter today.  When total time is documented, this includes both the face-to-face and non-face-to-face time personally spent before, during and after the visit on the date of the encounter.

## 2020-08-02 ENCOUNTER — Other Ambulatory Visit: Payer: Self-pay

## 2020-08-02 ENCOUNTER — Encounter: Payer: Self-pay | Admitting: Nurse Practitioner

## 2020-08-02 ENCOUNTER — Ambulatory Visit (INDEPENDENT_AMBULATORY_CARE_PROVIDER_SITE_OTHER): Payer: PRIVATE HEALTH INSURANCE | Admitting: Nurse Practitioner

## 2020-08-02 VITALS — BP 122/82 | HR 97 | Ht 65.87 in | Wt 238.0 lb

## 2020-08-02 DIAGNOSIS — R7301 Impaired fasting glucose: Secondary | ICD-10-CM

## 2020-08-02 DIAGNOSIS — Z6841 Body Mass Index (BMI) 40.0 and over, adult: Secondary | ICD-10-CM

## 2020-08-02 DIAGNOSIS — Z1159 Encounter for screening for other viral diseases: Secondary | ICD-10-CM

## 2020-08-02 DIAGNOSIS — Z0000 Encounter for general adult medical examination without abnormal findings: Secondary | ICD-10-CM

## 2020-08-02 DIAGNOSIS — Z114 Encounter for screening for human immunodeficiency virus [HIV]: Secondary | ICD-10-CM

## 2020-08-02 LAB — URINALYSIS, ROUTINE W REFLEX MICROSCOPIC
Bilirubin, UA: NEGATIVE
Glucose, UA: NEGATIVE
Leukocytes,UA: NEGATIVE
Nitrite, UA: NEGATIVE
Protein,UA: NEGATIVE
Specific Gravity, UA: 1.025 (ref 1.005–1.030)
Urobilinogen, Ur: 0.2 mg/dL (ref 0.2–1.0)
pH, UA: 5 (ref 5.0–7.5)

## 2020-08-02 LAB — MICROSCOPIC EXAMINATION: WBC, UA: NONE SEEN /hpf (ref 0–5)

## 2020-08-02 NOTE — Assessment & Plan Note (Signed)
Labs ordered today. Will make recommendations based on lab results. Follow up in 6 months for reevaluation. Call sooner if concerns arise.

## 2020-08-02 NOTE — Assessment & Plan Note (Signed)
Recommend a healthy lifestyle with diet and exercise.

## 2020-08-02 NOTE — Progress Notes (Signed)
BP 122/82   Pulse 97   Ht 5' 5.87" (1.673 m)   Wt 238 lb (108 kg)   SpO2 100%   BMI 38.57 kg/m    Subjective:    Patient ID: Mackenzie Kirby, female    DOB: December 09, 1990, 30 y.o.   MRN: [id number]  HPI: Mackenzie Kirby is a 30 y.o. female presenting on 08/02/2020 for comprehensive medical examination. Current medical complaints include:none  She currently lives with: Menopausal Symptoms: no   Denies HA, CP, SOB, dizziness, palpitations, visual changes, and lower extremity swelling.   Depression Screen done today and results listed below:  Depression screen [facility] 2/9 06/20/2020 06/08/2017  Decreased Interest 0 1  Down, Depressed, Hopeless 0 1  PHQ - 2 Score 0 2  Altered sleeping 0 3  Tired, decreased energy 0 1  Change in appetite 0 1  Feeling bad or failure about yourself  0 0  Trouble concentrating 0 1  Moving slowly or fidgety/restless 0 3  Suicidal thoughts 0 1  PHQ-9 Score 0 12  Difficult doing work/chores - Somewhat difficult    The patient does not have a history of falls. I did complete a risk assessment for falls. A plan of care for falls was documented.   Past Medical History:  Past Medical History:  Diagnosis Date   Allergic rhinitis    Fibroid    Fibroids     Surgical History:  Past Surgical History:  Procedure Laterality Date   WISDOM TOOTH EXTRACTION      Medications:  Current Outpatient Medications on File Prior to Visit  Medication Sig   fexofenadine (ALLEGRA ALLERGY) 180 MG tablet Take 1 tablet (180 mg total) by mouth daily.   fluticasone (FLONASE) 50 MCG/ACT nasal spray Place 2 sprays into both nostrils daily.   No current facility-administered medications on file prior to visit.    Allergies:  No Known Allergies  Social History:  Social History   Socioeconomic History   Marital status: Married    Spouse name: Not on file   Number of children: Not on file   Years of education: Not on file   Highest education level: Not on file   Occupational History   Not on file  Tobacco Use   Smoking status: Former    Packs/day: 0.25    Pack years: 0.00    Types: Cigarettes   Smokeless tobacco: Never   Tobacco comments:    pt states she stopped smoking about 4-5 months ago  Vaping Use   Vaping Use: Every day  Substance and Sexual Activity   Alcohol use: No    Comment: rare   Drug use: No   Sexual activity: Yes    Birth control/protection: None  Other Topics Concern   Not on file  Social History Narrative   Not on file   Social Determinants of Health   Financial Resource Strain: Not on file  Food Insecurity: Not on file  Transportation Needs: Not on file  Physical Activity: Not on file  Stress: Not on file  Social Connections: Not on file  Intimate Partner Violence: Not on file   Social History   Tobacco Use  Smoking Status Former   Packs/day: 0.25   Pack years: 0.00   Types: Cigarettes  Smokeless Tobacco Never  Tobacco Comments   pt states she stopped smoking about 4-5 months ago   Social History   Substance and Sexual Activity  Alcohol Use No   Comment: rare  Family History:  Family History  Problem Relation Age of Onset   Gestational diabetes Mother     Past medical history, surgical history, medications, allergies, family history and social history reviewed with patient today and changes made to appropriate areas of the chart.   Review of Systems  Eyes:  Negative for blurred vision and double vision.  Respiratory:  Negative for shortness of breath.   Cardiovascular:  Negative for chest pain, palpitations and leg swelling.  Neurological:  Negative for dizziness and headaches.  All other ROS negative except what is listed above and in the HPI.      Objective:    BP 122/82   Pulse 97   Ht 5' 5.87" (1.673 m)   Wt 238 lb (108 kg)   SpO2 100%   BMI 38.57 kg/m   Wt Readings from Last 3 Encounters:  08/02/20 238 lb (108 kg)  06/20/20 240 lb 9.6 oz (109.1 kg)  10/13/19 250 lb  (113.4 kg)    Physical Exam Vitals and nursing note reviewed.  Constitutional:      General: She is awake. She is not in acute distress.    Appearance: She is well-developed. She is obese. She is not ill-appearing.  HENT:     Head: Normocephalic and atraumatic.     Right Ear: Hearing, tympanic membrane, ear canal and external ear normal. No drainage.     Left Ear: Hearing, tympanic membrane, ear canal and external ear normal. No drainage.     Nose: Nose normal.     Right Sinus: No maxillary sinus tenderness or frontal sinus tenderness.     Left Sinus: No maxillary sinus tenderness or frontal sinus tenderness.     Mouth/Throat:     Mouth: Mucous membranes are moist.     Pharynx: Oropharynx is clear. Uvula midline. No pharyngeal swelling, oropharyngeal exudate or posterior oropharyngeal erythema.  Eyes:     General: Lids are normal.        Right eye: No discharge.        Left eye: No discharge.     Extraocular Movements: Extraocular movements intact.     Conjunctiva/sclera: Conjunctivae normal.     Pupils: Pupils are equal, round, and reactive to light.     Visual Fields: Right eye visual fields normal and left eye visual fields normal.  Neck:     Thyroid: No thyromegaly.     Vascular: No carotid bruit.     Trachea: Trachea normal.  Cardiovascular:     Rate and Rhythm: Normal rate and regular rhythm.     Heart sounds: Normal heart sounds. No murmur heard.   No gallop.  Pulmonary:     Effort: Pulmonary effort is normal. No accessory muscle usage or respiratory distress.     Breath sounds: Normal breath sounds.  Chest:  Breasts:    Right: Normal. No axillary adenopathy or supraclavicular adenopathy.     Left: Normal. No axillary adenopathy or supraclavicular adenopathy.  Abdominal:     General: Bowel sounds are normal.     Palpations: Abdomen is soft. There is no hepatomegaly or splenomegaly.     Tenderness: There is no abdominal tenderness.  Musculoskeletal:        General:  Normal range of motion.     Cervical back: Normal range of motion and neck supple.     Right lower leg: No edema.     Left lower leg: No edema.  Lymphadenopathy:     Head:  Right side of head: No submental, submandibular, tonsillar, preauricular or posterior auricular adenopathy.     Left side of head: No submental, submandibular, tonsillar, preauricular or posterior auricular adenopathy.     Cervical: No cervical adenopathy.     Upper Body:     Right upper body: No supraclavicular, axillary or pectoral adenopathy.     Left upper body: No supraclavicular, axillary or pectoral adenopathy.  Skin:    General: Skin is warm and dry.     Capillary Refill: Capillary refill takes less than 2 seconds.     Findings: No rash.  Neurological:     Mental Status: She is alert and oriented to person, place, and time.     Cranial Nerves: Cranial nerves are intact.     Gait: Gait is intact.     Deep Tendon Reflexes: Reflexes are normal and symmetric.     Reflex Scores:      Brachioradialis reflexes are 2+ on the right side and 2+ on the left side.      Patellar reflexes are 2+ on the right side and 2+ on the left side. Psychiatric:        Attention and Perception: Attention normal.        Mood and Affect: Mood normal.        Speech: Speech normal.        Behavior: Behavior normal. Behavior is cooperative.        Thought Content: Thought content normal.        Judgment: Judgment normal.    Results for orders placed or performed during the hospital encounter of 12/28/17  Lipase, blood  Result Value Ref Range   Lipase 37 11 - 51 U/L  Comprehensive metabolic panel  Result Value Ref Range   Sodium 137 135 - 145 mmol/L   Potassium 4.1 3.5 - 5.1 mmol/L   Chloride 104 98 - 111 mmol/L   CO2 22 22 - 32 mmol/L   Glucose, Bld 114 (H) 70 - 99 mg/dL   BUN 9 6 - 20 mg/dL   Creatinine, Ser 0.63 0.44 - 1.00 mg/dL   Calcium 9.1 8.9 - 10.3 mg/dL   Total Protein 7.0 6.5 - 8.1 g/dL   Albumin 4.1 3.5 -  5.0 g/dL   AST 28 15 - 41 U/L   ALT 37 0 - 44 U/L   Alkaline Phosphatase 58 38 - 126 U/L   Total Bilirubin 0.5 0.3 - 1.2 mg/dL   GFR calc non Af Amer >60 >60 mL/min   GFR calc Af Amer >60 >60 mL/min   Anion gap 11 5 - 15  CBC  Result Value Ref Range   WBC 9.2 4.0 - 10.5 K/uL   RBC 5.16 (H) 3.87 - 5.11 MIL/uL   Hemoglobin 14.3 12.0 - 15.0 g/dL   HCT 42.7 36.0 - 46.0 %   MCV 82.8 80.0 - 100.0 fL   MCH 27.7 26.0 - 34.0 pg   MCHC 33.5 30.0 - 36.0 g/dL   RDW 12.7 11.5 - 15.5 %   Platelets 362 150 - 400 K/uL   nRBC 0.0 0.0 - 0.2 %  Urinalysis, Complete w Microscopic  Result Value Ref Range   Color, Urine YELLOW (A) YELLOW   APPearance CLEAR (A) CLEAR   Specific Gravity, Urine 1.025 1.005 - 1.030   pH 5.0 5.0 - 8.0   Glucose, UA NEGATIVE NEGATIVE mg/dL   Hgb urine dipstick NEGATIVE NEGATIVE   Bilirubin Urine NEGATIVE NEGATIVE   Ketones, ur  NEGATIVE NEGATIVE mg/dL   Protein, ur 30 (A) NEGATIVE mg/dL   Nitrite NEGATIVE NEGATIVE   Leukocytes, UA NEGATIVE NEGATIVE   RBC / HPF 0-5 0 - 5 RBC/hpf   WBC, UA 0-5 0 - 5 WBC/hpf   Bacteria, UA RARE (A) NONE SEEN   Squamous Epithelial / LPF 6-10 0 - 5   Mucus PRESENT    Ca Oxalate Crys, UA PRESENT   Pregnancy, urine POC  Result Value Ref Range   Preg Test, Ur NEGATIVE NEGATIVE      Assessment & Plan:   Problem List Items Addressed This Visit       Endocrine   IFG (impaired fasting glucose)    Labs ordered today. Will make recommendations based on lab results. Follow up in 6 months for reevaluation. Call sooner if concerns arise.        Relevant Orders   HgB A1c     Other   Obesity    Recommend a healthy lifestyle with diet and exercise.        Other Visit Diagnoses     Annual physical exam    -  Primary   Health maintenance reviewed during visit. Labs ordered today. Up to date on vaccinations.   Relevant Orders   CBC with Differential/Platelet   Comprehensive metabolic panel   Lipid panel   TSH   Urinalysis,  Routine w reflex microscopic   Encounter for hepatitis C screening test for low risk patient       Relevant Orders   Hepatitis C Antibody   Screening for HIV (human immunodeficiency virus)       Relevant Orders   HIV Antibody (routine testing w rflx)        Follow up plan: Return in about 6 months (around 02/02/2021) for PAP and lab work .   LABORATORY TESTING:  - Pap smear: up to date  IMMUNIZATIONS:   - Tdap: Tetanus vaccination status reviewed: last tetanus booster within 10 years. - Influenza: Postponed to flu season - Pneumovax: Not applicable - Prevnar: Not applicable - HPV: Up to date - Zostavax vaccine: Not applicable  SCREENING: -Mammogram: Not applicable  - Colonoscopy: Not applicable  - Bone Density: Not applicable  -Hearing Test: Not applicable  -Spirometry: Not applicable   PATIENT COUNSELING:   Advised to take 1 mg of folate supplement per day if capable of pregnancy.   Sexuality: Discussed sexually transmitted diseases, partner selection, use of condoms, avoidance of unintended pregnancy  and contraceptive alternatives.   Advised to avoid cigarette smoking.  I discussed with the patient that most people either abstain from alcohol or drink within safe limits (<=14/week and <=4 drinks/occasion for males, <=7/weeks and <= 3 drinks/occasion for females) and that the risk for alcohol disorders and other health effects rises proportionally with the number of drinks per week and how often a drinker exceeds daily limits.  Discussed cessation/primary prevention of drug use and availability of treatment for abuse.   Diet: Encouraged to adjust caloric intake to maintain  or achieve ideal body weight, to reduce intake of dietary saturated fat and total fat, to limit sodium intake by avoiding high sodium foods and not adding table salt, and to maintain adequate dietary potassium and calcium preferably from fresh fruits, vegetables, and low-fat dairy products.     stressed the importance of regular exercise  Injury prevention: Discussed safety belts, safety helmets, smoke detector, smoking near bedding or upholstery.   Dental health: Discussed importance of regular tooth brushing,  flossing, and dental visits.    NEXT PREVENTATIVE PHYSICAL DUE IN 1 YEAR. Return in about 6 months (around 02/02/2021) for PAP and lab work .

## 2020-08-03 LAB — CBC WITH DIFFERENTIAL/PLATELET
Basophils Absolute: 0 10*3/uL (ref 0.0–0.2)
Basos: 1 %
EOS (ABSOLUTE): 0.1 10*3/uL (ref 0.0–0.4)
Eos: 2 %
Hematocrit: 45.8 % (ref 34.0–46.6)
Hemoglobin: 15 g/dL (ref 11.1–15.9)
Immature Grans (Abs): 0 10*3/uL (ref 0.0–0.1)
Immature Granulocytes: 1 %
Lymphocytes Absolute: 1.7 10*3/uL (ref 0.7–3.1)
Lymphs: 25 %
MCH: 27.8 pg (ref 26.6–33.0)
MCHC: 32.8 g/dL (ref 31.5–35.7)
MCV: 85 fL (ref 79–97)
Monocytes Absolute: 0.5 10*3/uL (ref 0.1–0.9)
Monocytes: 7 %
Neutrophils Absolute: 4.4 10*3/uL (ref 1.4–7.0)
Neutrophils: 64 %
Platelets: 328 10*3/uL (ref 150–450)
RBC: 5.4 x10E6/uL — ABNORMAL HIGH (ref 3.77–5.28)
RDW: 12.5 % (ref 11.7–15.4)
WBC: 6.7 10*3/uL (ref 3.4–10.8)

## 2020-08-03 LAB — LIPID PANEL
Chol/HDL Ratio: 4.7 ratio — ABNORMAL HIGH (ref 0.0–4.4)
Cholesterol, Total: 177 mg/dL (ref 100–199)
HDL: 38 mg/dL — ABNORMAL LOW (ref >39)
LDL Chol Calc (NIH): 115 mg/dL — ABNORMAL HIGH (ref 0–99)
Triglycerides: 132 mg/dL (ref 0–149)
VLDL Cholesterol Cal: 24 mg/dL (ref 5–40)

## 2020-08-03 LAB — COMPREHENSIVE METABOLIC PANEL
ALT: 16 IU/L (ref 0–32)
AST: 17 IU/L (ref 0–40)
Albumin/Globulin Ratio: 1.9 (ref 1.2–2.2)
Albumin: 4.5 g/dL (ref 3.9–5.0)
Alkaline Phosphatase: 64 IU/L (ref 44–121)
BUN/Creatinine Ratio: 15 (ref 9–23)
BUN: 12 mg/dL (ref 6–20)
Bilirubin Total: 0.3 mg/dL (ref 0.0–1.2)
CO2: 20 mmol/L (ref 20–29)
Calcium: 9.5 mg/dL (ref 8.7–10.2)
Chloride: 104 mmol/L (ref 96–106)
Creatinine, Ser: 0.8 mg/dL (ref 0.57–1.00)
Globulin, Total: 2.4 g/dL (ref 1.5–4.5)
Glucose: 96 mg/dL (ref 65–99)
Potassium: 4.4 mmol/L (ref 3.5–5.2)
Sodium: 136 mmol/L (ref 134–144)
Total Protein: 6.9 g/dL (ref 6.0–8.5)
eGFR: 102 mL/min/{1.73_m2} (ref >59)

## 2020-08-03 LAB — HEPATITIS C ANTIBODY: Hep C Virus Ab: 0.1 s/co ratio (ref 0.0–0.9)

## 2020-08-03 LAB — HEMOGLOBIN A1C
Est. average glucose Bld gHb Est-mCnc: 120 mg/dL
Hgb A1c MFr Bld: 5.8 % — ABNORMAL HIGH (ref 4.8–5.6)

## 2020-08-03 LAB — HIV ANTIBODY (ROUTINE TESTING W REFLEX): HIV Screen 4th Generation wRfx: NONREACTIVE

## 2020-08-03 LAB — TSH: TSH: 1.92 u[IU]/mL (ref 0.450–4.500)

## 2020-08-05 NOTE — Progress Notes (Signed)
Hi Mackenzie Kirby, it was good to see you last week. Overall your lab work looks good.  Red blood cells are elevated but it could be due to being a former smoker.  We will monitor this in the future.  Your liver, kidneys, thyroid, hepatitis C, HIV, urinalysis and electrolytes look good. Cholesterol is elevated.  Recommend following a low fat diet and exercise 5x weekly for at least 30 minutes.  Your A1c remains int he prediabetic range.  We will ocntinue to monitor this at future appointments.  See you next time.

## 2020-10-30 ENCOUNTER — Other Ambulatory Visit: Payer: Self-pay

## 2020-10-30 ENCOUNTER — Ambulatory Visit
Admission: EM | Admit: 2020-10-30 | Discharge: 2020-10-30 | Disposition: A | Discharge status: Home / Self Care | Payer: PRIVATE HEALTH INSURANCE | Attending: Family Medicine | Admitting: Family Medicine

## 2020-10-30 DIAGNOSIS — K529 Noninfective gastroenteritis and colitis, unspecified: Secondary | ICD-10-CM | POA: Diagnosis not present

## 2020-10-30 MED ORDER — DIPHENOXYLATE-ATROPINE 2.5-0.025 MG PO TABS: 1.0000 | ORAL_TABLET | Freq: Four times a day (QID) | ORAL | 0 refills | Status: DC | PRN

## 2020-10-30 NOTE — ED Triage Notes (Signed)
Pt reports having diarrhea that began today. Also sts she has had a fever today. Neg pcr covid test today.

## 2020-10-30 NOTE — ED Provider Notes (Signed)
[facility]    CSN: [id number] Arrival date & time: 10/30/20  1746      History   Chief Complaint Chief Complaint  Patient presents with   Diarrhea    HPI 30 year old female presents for evaluation of the above.  Patient states that she had a migraine headache yesterday.  This has improved.  She states that she developed diarrhea today.  She is also had reported fever.  Temperature has been as high as 100.2.  No reported sick contacts.  She has taken Imodium without relief of her diarrhea.  She denies respiratory symptoms.  She states that she is trying to stay hydrated.  She has taken a PCR COVID test which has been negative.  Past Medical History:  Diagnosis Date   Allergic rhinitis    Fibroid    Fibroids     Patient Active Problem List   Diagnosis Date Noted   Menorrhagia with regular cycle 01/03/2018   Uterine leiomyoma 01/03/2018   Obesity 10/04/2017   IFG (impaired fasting glucose) 06/08/2017    Past Surgical History:  Procedure Laterality Date   WISDOM TOOTH EXTRACTION      OB History     Gravida  0   Para  0   Term  0   Preterm  0   AB  0   Living  0      SAB  0   IAB  0   Ectopic  0   Multiple  0   Live Births  0            Home Medications    Prior to Admission medications   Medication Sig Start Date End Date Taking? Authorizing Provider  diphenoxylate-atropine (LOMOTIL) 2.5-0.025 MG tablet Take 1 tablet by mouth 4 (four) times daily as needed for diarrhea or loose stools. 10/30/20  Yes Dillan Candela G, DO  fexofenadine (ALLEGRA ALLERGY) 180 MG tablet Take 1 tablet (180 mg total) by mouth daily. 10/13/19   Rodriguez-Southworth, Sunday Spillers, PA-C  fluticasone (FLONASE) 50 MCG/ACT nasal spray Place 2 sprays into both nostrils daily. 10/13/19   Rodriguez-Southworth, Sunday Spillers, PA-C    Family History Family History  Problem Relation Age of Onset   Gestational diabetes Mother     Social History Social History   Tobacco Use    Smoking status: Former    Packs/day: 0.25    Types: Cigarettes   Smokeless tobacco: Never   Tobacco comments:    pt states she stopped smoking about 4-5 months ago  Vaping Use   Vaping Use: Every day  Substance Use Topics   Alcohol use: No    Comment: rare   Drug use: No     Allergies   Patient has no known allergies.   Review of Systems Review of Systems  Gastrointestinal:  Positive for diarrhea.  Neurological:  Positive for headaches.    Physical Exam Triage Vital Signs ED Triage Vitals  Enc Vitals Group     BP 10/30/20 1807 121/90     Pulse Rate 10/30/20 1807 (!) 126     Resp 10/30/20 1807 18     Temp 10/30/20 1807 99.4 F (37.4 C)     Temp Source 10/30/20 1807 Oral     SpO2 10/30/20 1807 100 %     Weight 10/30/20 1808 234 lb (106.1 kg)     Height 10/30/20 1808 5\' 6"  (1.676 m)     Head Circumference --      Peak Flow --  Pain Score 10/30/20 1808 4     Pain Loc --      Pain Edu? --      Excl. in [place]? --    No data found.  Updated Vital Signs BP 121/90   Pulse (!) 126   Temp 99.4 F (37.4 C) (Oral)   Resp 18   Ht 5\' 6"  (1.676 m)   Wt 106.1 kg   SpO2 100%   BMI 37.77 kg/m   Visual Acuity Right Eye Distance:   Left Eye Distance:   Bilateral Distance:    Right Eye Near:   Left Eye Near:    Bilateral Near:     Physical Exam Vitals and nursing note reviewed.  Constitutional:      General: She is not in acute distress. HENT:     Head: Normocephalic and atraumatic.     Right Ear: Tympanic membrane normal.     Left Ear: Tympanic membrane normal.     Mouth/Throat:     Mouth: Mucous membranes are moist.     Pharynx: Oropharynx is clear.  Eyes:     General:        Right eye: No discharge.        Left eye: No discharge.     Conjunctiva/sclera: Conjunctivae normal.  Cardiovascular:     Rate and Rhythm: Regular rhythm. Tachycardia present.  Pulmonary:     Effort: Pulmonary effort is normal.     Breath sounds: Normal breath sounds. No  wheezing, rhonchi or rales.  Abdominal:     General: There is no distension.     Palpations: Abdomen is soft.     Comments: Diffusely tender to palpation  Neurological:     Mental Status: She is alert.     UC Treatments / Results  Labs (all labs ordered are listed, but only abnormal results are displayed) Labs Reviewed - No data to display  EKG   Radiology No results found.  Procedures Procedures (including critical care time)  Medications Ordered in UC Medications - No data to display  Initial Impression / Assessment and Plan / UC Course  I have reviewed the triage vital signs and the nursing notes.  Pertinent labs & imaging results that were available during my care of the patient were reviewed by me and considered in my medical decision making (see chart for details).    30 year old female presents with suspected gastroenteritis.  Advised lots of fluids and rest.  Tylenol as needed.  Lomotil if needed for diarrhea.  Advised to avoid if she can.  She has had COVID testing so will not pursue additional testing today.  Supportive care.  Work note given.  Final Clinical Impressions(s) / UC Diagnoses   Final diagnoses:  Gastroenteritis     Discharge Instructions      Rest.  Fluids.  Use the antidiarrheal if you need to.  Do not combine with Imodium.  Tylenol 1000 mg 3 times a day as needed for fever.  If you worsen, please go to the ER.  Take care  Dr. Lacinda Axon    ED Prescriptions     Medication Sig Dispense Auth. Provider   diphenoxylate-atropine (LOMOTIL) 2.5-0.025 MG tablet Take 1 tablet by mouth 4 (four) times daily as needed for diarrhea or loose stools. 30 tablet Coral Spikes, DO      PDMP not reviewed this encounter.   Coral Spikes, [place] 10/30/20 1850

## 2020-10-30 NOTE — Discharge Instructions (Signed)
Rest.  Fluids.  Use the antidiarrheal if you need to.  Do not combine with Imodium.  Tylenol 1000 mg 3 times a day as needed for fever.  If you worsen, please go to the ER.  Take care  Dr. Lacinda Axon

## 2021-01-30 ENCOUNTER — Ambulatory Visit
Admission: EM | Admit: 2021-01-30 | Discharge: 2021-01-30 | Disposition: A | Discharge status: Home / Self Care | Payer: BC Managed Care – PPO | Attending: Internal Medicine | Admitting: Internal Medicine

## 2021-01-30 ENCOUNTER — Other Ambulatory Visit: Payer: Self-pay

## 2021-01-30 DIAGNOSIS — J069 Acute upper respiratory infection, unspecified: Secondary | ICD-10-CM | POA: Insufficient documentation

## 2021-01-30 DIAGNOSIS — H66001 Acute suppurative otitis media without spontaneous rupture of ear drum, right ear: Secondary | ICD-10-CM | POA: Diagnosis present

## 2021-01-30 LAB — GROUP A STREP BY PCR: Group A Strep by PCR: NOT DETECTED

## 2021-01-30 MED ORDER — IPRATROPIUM BROMIDE 0.06 % NA SOLN: 2.0000 | Freq: Four times a day (QID) | NASAL | 12 refills | Status: DC

## 2021-01-30 MED ORDER — AMOXICILLIN-POT CLAVULANATE 875-125 MG PO TABS: 1.0000 | ORAL_TABLET | Freq: Two times a day (BID) | ORAL | 0 refills | Status: DC

## 2021-01-30 NOTE — Discharge Instructions (Signed)
Take the Augmentin twice daily for 10 days with food for treatment of your ear infection.  Take an over-the-counter probiotic 1 hour after each dose of antibiotic to prevent diarrhea.  Use over-the-counter Tylenol and ibuprofen as needed for pain or fever.  Place a hot water bottle, or heating pad, underneath your pillowcase at night to help dilate up your ear and aid in pain relief as well as resolution of the infection.  Use the Atrovent nasal spray, 2 squirts in each nostril every 6 hours as needed for postnasal drip.   Return for reevaluation for any new or worsening symptoms.

## 2021-01-30 NOTE — ED Triage Notes (Signed)
Pt c/o rt ear pain, and sore throat. Pt was tested for covid, flu and rsv all negative yesterday. Sxs started 4 days ago.

## 2021-01-30 NOTE — ED Provider Notes (Signed)
[facility]    CSN: [id number] Arrival date & time: 01/30/21  5597      History   Chief Complaint Chief Complaint  Patient presents with   Sore Throat    HPI Mackenzie Kirby is a 31 y.o. female.   HPI  31 year old female here for evaluation of respiratory complaints.  Patient reports that she has been experiencing right ear pain, sore throat, postnasal drip, and a cough for the last 4 days.  The cough is actually been going on longer as she is a smoker and she attributes it to that.  The cough is nonproductive.  She denies fever, changes to hearing, drainage from the ear, ringing in her ear, nasal congestion, runny nose, shortness breath, or wheezing.  She states that she knows her right eardrum is erythematous as she has ear cleaner with fiberoptic camera that she used to visualize her own tympanic membrane this morning.  Past Medical History:  Diagnosis Date   Allergic rhinitis    Fibroid    Fibroids     Patient Active Problem List   Diagnosis Date Noted   Menorrhagia with regular cycle 01/03/2018   Uterine leiomyoma 01/03/2018   Obesity 10/04/2017   IFG (impaired fasting glucose) 06/08/2017    Past Surgical History:  Procedure Laterality Date   WISDOM TOOTH EXTRACTION      OB History     Gravida  0   Para  0   Term  0   Preterm  0   AB  0   Living  0      SAB  0   IAB  0   Ectopic  0   Multiple  0   Live Births  0            Home Medications    Prior to Admission medications   Medication Sig Start Date End Date Taking? Authorizing Provider  amoxicillin-clavulanate (AUGMENTIN) 875-125 MG tablet Take 1 tablet by mouth every 12 (twelve) hours for 10 days. 01/30/21 02/09/21 Yes Margarette Canada, NP  ipratropium (ATROVENT) 0.06 % nasal spray Place 2 sprays into both nostrils 4 (four) times daily. 01/30/21  Yes Margarette Canada, NP  diphenoxylate-atropine (LOMOTIL) 2.5-0.025 MG tablet Take 1 tablet by mouth 4 (four) times daily as needed  for diarrhea or loose stools. 10/30/20   Coral Spikes, DO  fexofenadine (ALLEGRA ALLERGY) 180 MG tablet Take 1 tablet (180 mg total) by mouth daily. 10/13/19   Rodriguez-Southworth, Sunday Spillers, PA-C  fluticasone (FLONASE) 50 MCG/ACT nasal spray Place 2 sprays into both nostrils daily. 10/13/19   Rodriguez-Southworth, Sunday Spillers, PA-C    Family History Family History  Problem Relation Age of Onset   Gestational diabetes Mother     Social History Social History   Tobacco Use   Smoking status: Former    Packs/day: 0.25    Types: Cigarettes   Smokeless tobacco: Never   Tobacco comments:    pt states she stopped smoking about 4-5 months ago  Vaping Use   Vaping Use: Every day  Substance Use Topics   Alcohol use: No    Comment: rare   Drug use: No     Allergies   Patient has no known allergies.   Review of Systems Review of Systems  Constitutional:  Negative for activity change, appetite change and fever.  HENT:  Positive for ear pain, postnasal drip and sore throat. Negative for congestion, ear discharge, rhinorrhea and tinnitus.   Respiratory:  Positive for cough. Negative  for shortness of breath and wheezing.   Hematological: Negative.   Psychiatric/Behavioral: Negative.      Physical Exam Triage Vital Signs ED Triage Vitals  Enc Vitals Group     BP --      Pulse Rate 01/30/21 0857 91     Resp 01/30/21 0857 16     Temp 01/30/21 0857 98.5 F (36.9 C)     Temp Source 01/30/21 0857 Oral     SpO2 01/30/21 0857 99 %     Weight 01/30/21 0855 233 lb 14.5 oz (106.1 kg)     Height 01/30/21 0855 5\' 6"  (1.676 m)     Head Circumference --      Peak Flow --      Pain Score 01/30/21 0854 6     Pain Loc --      Pain Edu? --      Excl. in [place]? --    No data found.  Updated Vital Signs Pulse 91    Temp 98.5 F (36.9 C) (Oral)    Resp 16    Ht 5\' 6"  (1.676 m)    Wt 233 lb 14.5 oz (106.1 kg)    SpO2 99%    BMI 37.75 kg/m   Visual Acuity Right Eye Distance:   Left Eye Distance:    Bilateral Distance:    Right Eye Near:   Left Eye Near:    Bilateral Near:     Physical Exam Vitals and nursing note reviewed.  Constitutional:      General: She is not in acute distress.    Appearance: Normal appearance. She is not ill-appearing.  HENT:     Head: Normocephalic and atraumatic.     Right Ear: Ear canal and external ear normal. There is no impacted cerumen.     Left Ear: Tympanic membrane, ear canal and external ear normal. There is no impacted cerumen.     Nose: Congestion and rhinorrhea present.     Mouth/Throat:     Mouth: Mucous membranes are moist.     Pharynx: Oropharynx is clear. Posterior oropharyngeal erythema present.  Cardiovascular:     Rate and Rhythm: Normal rate and regular rhythm.     Pulses: Normal pulses.     Heart sounds: Normal heart sounds. No murmur heard.   No friction rub. No gallop.  Pulmonary:     Effort: Pulmonary effort is normal.     Breath sounds: Normal breath sounds. No wheezing, rhonchi or rales.  Musculoskeletal:     Cervical back: Normal range of motion and neck supple. Tenderness present.  Lymphadenopathy:     Cervical: Cervical adenopathy present.  Skin:    General: Skin is warm and dry.     Capillary Refill: Capillary refill takes less than 2 seconds.     Findings: No erythema or rash.  Neurological:     General: No focal deficit present.     Mental Status: She is alert and oriented to person, place, and time.  Psychiatric:        Mood and Affect: Mood normal.        Behavior: Behavior normal.        Thought Content: Thought content normal.        Judgment: Judgment normal.     UC Treatments / Results  Labs (all labs ordered are listed, but only abnormal results are displayed) Labs Reviewed  GROUP A STREP BY PCR    EKG   Radiology No results found.  Procedures  Procedures (including critical care time)  Medications Ordered in UC Medications - No data to display  Initial Impression / Assessment and  Plan / UC Course  I have reviewed the triage vital signs and the nursing notes.  Pertinent labs & imaging results that were available during my care of the patient were reviewed by me and considered in my medical decision making (see chart for details).  Patient is a very pleasant, nontoxic-appearing 31 year old female here for evaluation of pain in her right ear and a sore throat that is been going on for the past 4 days.  She also endorses postnasal drip and a cough.  The cough is more long-term as she is a smoker and she attributes her cough symptoms to smoking and vaping.  Her physical exam reveals an erythematous injected right tympanic membrane.  The external auditory canal is clear.  Left TM is pearly gray with normal light reflex and clear external auditory canal.  Nasal mucosa is erythematous and edematous with scant clear discharge in both nares.  Posterior oropharynx is erythematous and injected with clear postnasal drip.  Patient does have anterior cervical lymphadenopathy that is tender on the left.  Cardiopulmonary exam reveals S1 and S2 heart sounds without murmur, rub, or gallop.  Lung sounds are clear to auscultation all fields.  Patient's exam is consistent with an upper respiratory infection and otitis media.  We will place patient on Augmentin twice daily for 10 days for treatment of the otitis media.  We will also give Atrovent nasal spray to help her with her nasal congestion postnasal drip.  Work note provided.   Final Clinical Impressions(s) / UC Diagnoses   Final diagnoses:  Upper respiratory tract infection, unspecified type  Non-recurrent acute suppurative otitis media of right ear without spontaneous rupture of tympanic membrane     Discharge Instructions      Take the Augmentin twice daily for 10 days with food for treatment of your ear infection.  Take an over-the-counter probiotic 1 hour after each dose of antibiotic to prevent diarrhea.  Use over-the-counter  Tylenol and ibuprofen as needed for pain or fever.  Place a hot water bottle, or heating pad, underneath your pillowcase at night to help dilate up your ear and aid in pain relief as well as resolution of the infection.  Use the Atrovent nasal spray, 2 squirts in each nostril every 6 hours as needed for postnasal drip.   Return for reevaluation for any new or worsening symptoms.     ED Prescriptions     Medication Sig Dispense Auth. Provider   amoxicillin-clavulanate (AUGMENTIN) 875-125 MG tablet Take 1 tablet by mouth every 12 (twelve) hours for 10 days. 20 tablet Margarette Canada, NP   ipratropium (ATROVENT) 0.06 % nasal spray Place 2 sprays into both nostrils 4 (four) times daily. 15 mL Margarette Canada, NP      PDMP not reviewed this encounter.   Margarette Canada, NP 01/30/21 [phone]

## 2021-01-31 NOTE — Progress Notes (Signed)
BP 123/85    Pulse 92    Temp 99.1 F (37.3 C) (Oral)    Ht 5' 6.4" (1.687 m)    Wt 238 lb 12.8 oz (108.3 kg)    LMP 01/17/2021 (Approximate)    SpO2 99%    BMI 38.08 kg/m    Subjective:    Patient ID: Mackenzie Kirby, female    DOB: Nov 28, 1990, 31 y.o.   MRN: [id number]  HPI: Mackenzie Kirby is a 31 y.o. female  Chief Complaint  Patient presents with   Gynecologic Exam   Patient presents to clinic for routine follow up.  She is due for her PAP.  She denies any concerns at visit today.   Denies HA, CP, SOB, dizziness, palpitations, visual changes, and lower extremity swelling.   Relevant past medical, surgical, family and social history reviewed and updated as indicated. Interim medical history since our last visit reviewed. Allergies and medications reviewed and updated.  Review of Systems  Eyes:  Negative for visual disturbance.  Respiratory:  Negative for cough, chest tightness and shortness of breath.   Cardiovascular:  Negative for chest pain, palpitations and leg swelling.  Neurological:  Negative for dizziness and headaches.   Per HPI unless specifically indicated above     Objective:    BP 123/85    Pulse 92    Temp 99.1 F (37.3 C) (Oral)    Ht 5' 6.4" (1.687 m)    Wt 238 lb 12.8 oz (108.3 kg)    LMP 01/17/2021 (Approximate)    SpO2 99%    BMI 38.08 kg/m   Wt Readings from Last 3 Encounters:  02/03/21 238 lb 12.8 oz (108.3 kg)  01/30/21 233 lb 14.5 oz (106.1 kg)  10/30/20 234 lb (106.1 kg)    Physical Exam Vitals and nursing note reviewed. Exam conducted with a chaperone present Yvonna Alanis, [place]).  Constitutional:      General: She is not in acute distress.    Appearance: Normal appearance. She is obese. She is not ill-appearing, toxic-appearing or diaphoretic.  HENT:     Head: Normocephalic.     Right Ear: External ear normal.     Left Ear: External ear normal.     Nose: Nose normal.     Mouth/Throat:     Mouth: Mucous membranes are moist.      Pharynx: Oropharynx is clear.  Eyes:     General:        Right eye: No discharge.        Left eye: No discharge.     Extraocular Movements: Extraocular movements intact.     Conjunctiva/sclera: Conjunctivae normal.     Pupils: Pupils are equal, round, and reactive to light.  Cardiovascular:     Rate and Rhythm: Normal rate and regular rhythm.     Heart sounds: No murmur heard. Pulmonary:     Effort: Pulmonary effort is normal. No respiratory distress.     Breath sounds: Normal breath sounds. No wheezing or rales.  Genitourinary:    Vagina: Normal.     Cervix: Normal.     Adnexa: Right adnexa normal and left adnexa normal.  Musculoskeletal:     Cervical back: Normal range of motion and neck supple.  Skin:    General: Skin is warm and dry.     Capillary Refill: Capillary refill takes less than 2 seconds.  Neurological:     General: No focal deficit present.     Mental Status: She is alert and  oriented to person, place, and time. Mental status is at baseline.  Psychiatric:        Mood and Affect: Mood normal.        Behavior: Behavior normal.        Thought Content: Thought content normal.        Judgment: Judgment normal.    Results for orders placed or performed during the hospital encounter of 01/30/21  Group A Strep by PCR   Specimen: Throat; Sterile Swab  Result Value Ref Range   Group A Strep by PCR NOT DETECTED NOT DETECTED      Assessment & Plan:   Problem List Items Addressed This Visit       Endocrine   IFG (impaired fasting glucose) - Primary    Labs ordered today. Will make recommendations based on lab results.      Relevant Orders   Comp Met (CMET)   HgB A1c     Other   Elevated LDL cholesterol level    Labs ordered today. Will make recommendations based on lab results.      Relevant Orders   Lipid Profile   Other Visit Diagnoses     Screening for cervical cancer       PAP obtained in normal fashion. Patient tolerated obtaining of specimen  without complication. Escorted by Yvonna Alanis, [place].   Relevant Orders   Cytology - PAP        Follow up plan: Return in about 6 months (around 08/03/2021) for HTN, HLD, DM2 FU.

## 2021-02-03 ENCOUNTER — Encounter: Payer: Self-pay | Admitting: Nurse Practitioner

## 2021-02-03 ENCOUNTER — Other Ambulatory Visit: Payer: Self-pay

## 2021-02-03 ENCOUNTER — Other Ambulatory Visit (HOSPITAL_COMMUNITY)
Admission: RE | Admit: 2021-02-03 | Discharge: 2021-02-03 | Disposition: A | Discharge status: Home / Self Care | Payer: PRIVATE HEALTH INSURANCE | Source: Ambulatory Visit | Attending: Nurse Practitioner | Admitting: Nurse Practitioner

## 2021-02-03 ENCOUNTER — Ambulatory Visit (INDEPENDENT_AMBULATORY_CARE_PROVIDER_SITE_OTHER): Payer: BC Managed Care – PPO | Admitting: Nurse Practitioner

## 2021-02-03 VITALS — BP 123/85 | HR 92 | Temp 99.1°F | Ht 66.4 in | Wt 238.8 lb

## 2021-02-03 DIAGNOSIS — R7301 Impaired fasting glucose: Secondary | ICD-10-CM | POA: Diagnosis not present

## 2021-02-03 DIAGNOSIS — Z124 Encounter for screening for malignant neoplasm of cervix: Secondary | ICD-10-CM

## 2021-02-03 DIAGNOSIS — E7800 Pure hypercholesterolemia, unspecified: Secondary | ICD-10-CM | POA: Insufficient documentation

## 2021-02-03 NOTE — Assessment & Plan Note (Signed)
Labs ordered today.  Will make recommendations based on lab results. ?

## 2021-02-04 LAB — COMPREHENSIVE METABOLIC PANEL
ALT: 20 IU/L (ref 0–32)
AST: 22 IU/L (ref 0–40)
Albumin/Globulin Ratio: 2 (ref 1.2–2.2)
Albumin: 4.5 g/dL (ref 3.9–5.0)
Alkaline Phosphatase: 68 IU/L (ref 44–121)
BUN/Creatinine Ratio: 13 (ref 9–23)
BUN: 11 mg/dL (ref 6–20)
Bilirubin Total: 0.3 mg/dL (ref 0.0–1.2)
CO2: 20 mmol/L (ref 20–29)
Calcium: 9.5 mg/dL (ref 8.7–10.2)
Chloride: 103 mmol/L (ref 96–106)
Creatinine, Ser: 0.85 mg/dL (ref 0.57–1.00)
Globulin, Total: 2.3 g/dL (ref 1.5–4.5)
Glucose: 114 mg/dL — ABNORMAL HIGH (ref 70–99)
Potassium: 4.4 mmol/L (ref 3.5–5.2)
Sodium: 140 mmol/L (ref 134–144)
Total Protein: 6.8 g/dL (ref 6.0–8.5)
eGFR: 94 mL/min/{1.73_m2} (ref >59)

## 2021-02-04 LAB — LIPID PANEL
Chol/HDL Ratio: 4.7 ratio — ABNORMAL HIGH (ref 0.0–4.4)
Cholesterol, Total: 178 mg/dL (ref 100–199)
HDL: 38 mg/dL — ABNORMAL LOW (ref >39)
LDL Chol Calc (NIH): 117 mg/dL — ABNORMAL HIGH (ref 0–99)
Triglycerides: 128 mg/dL (ref 0–149)
VLDL Cholesterol Cal: 23 mg/dL (ref 5–40)

## 2021-02-04 LAB — HEMOGLOBIN A1C
Est. average glucose Bld gHb Est-mCnc: 126 mg/dL
Hgb A1c MFr Bld: 6 % — ABNORMAL HIGH (ref 4.8–5.6)

## 2021-02-04 LAB — CYTOLOGY - PAP: Diagnosis: NEGATIVE

## 2021-02-04 NOTE — Progress Notes (Signed)
Please let patient know that her lab work looks good. A1c is consistent with prior at 6.0.  Cholesterol also remains slightly elevated but consistent with prior.  Follow up as discussed.  We will call her when he PAP results return.

## 2021-02-05 NOTE — Progress Notes (Signed)
Please let patient know that her PAP was normal.  We will repeat it in 3 years.

## 2021-05-02 HISTORY — PX: CARPAL TUNNEL RELEASE: SHX101

## 2021-07-31 DIAGNOSIS — R7303 Prediabetes: Secondary | ICD-10-CM | POA: Insufficient documentation

## 2021-07-31 NOTE — Progress Notes (Signed)
BP 124/90   Pulse 99   Temp 98.8 F (37.1 C) (Oral)   Ht 5' 6.42" (1.687 m)   Wt 258 lb (117 kg)   SpO2 100%   BMI 41.12 kg/m    Subjective:    Patient ID: Mackenzie Kirby, female    DOB: 25-Dec-1990, 31 y.o.   MRN: [id number]  HPI: Mackenzie Kirby is a 31 y.o. female  Chief Complaint  Patient presents with   Hypertension   Hyperlipidemia   Diabetes   Right mid back    Started Thursday or Friday last week, comes and goes.     Patient presents to clinic for routine follow up.  She has a sharp pain on the right side of her back and that comes and goes.  It is near her ribs.  Does not remember hurting herself.  She may have slept wrong.  She hasn't taken anything to help improve the symptoms.  Patient states it is worse when she sits or moves a certain way.  Symptoms are improved with a hearting pad.     Denies HA, CP, SOB, dizziness, palpitations, visual changes, and lower extremity swelling.   Relevant past medical, surgical, family and social history reviewed and updated as indicated. Interim medical history since our last visit reviewed. Allergies and medications reviewed and updated.  Review of Systems  Eyes:  Negative for visual disturbance.  Respiratory:  Negative for cough, chest tightness and shortness of breath.   Cardiovascular:  Negative for chest pain, palpitations and leg swelling.  Musculoskeletal:  Positive for back pain.  Neurological:  Negative for dizziness and headaches.    Per HPI unless specifically indicated above     Objective:    BP 124/90   Pulse 99   Temp 98.8 F (37.1 C) (Oral)   Ht 5' 6.42" (1.687 m)   Wt 258 lb (117 kg)   SpO2 100%   BMI 41.12 kg/m   Wt Readings from Last 3 Encounters:  08/04/21 258 lb (117 kg)  02/03/21 238 lb 12.8 oz (108.3 kg)  01/30/21 233 lb 14.5 oz (106.1 kg)    Physical Exam Vitals and nursing note reviewed.  Constitutional:      General: She is not in acute distress.    Appearance: Normal appearance.  She is normal weight. She is not ill-appearing, toxic-appearing or diaphoretic.  HENT:     Head: Normocephalic.     Right Ear: External ear normal.     Left Ear: External ear normal.     Nose: Nose normal.     Mouth/Throat:     Mouth: Mucous membranes are moist.     Pharynx: Oropharynx is clear.  Eyes:     General:        Right eye: No discharge.        Left eye: No discharge.     Extraocular Movements: Extraocular movements intact.     Conjunctiva/sclera: Conjunctivae normal.     Pupils: Pupils are equal, round, and reactive to light.  Cardiovascular:     Rate and Rhythm: Normal rate and regular rhythm.     Heart sounds: No murmur heard. Pulmonary:     Effort: Pulmonary effort is normal. No respiratory distress.     Breath sounds: Normal breath sounds. No wheezing or rales.  Musculoskeletal:     Cervical back: Normal range of motion and neck supple.       Back:  Skin:    General: Skin is warm and dry.  Capillary Refill: Capillary refill takes less than 2 seconds.  Neurological:     General: No focal deficit present.     Mental Status: She is alert and oriented to person, place, and time. Mental status is at baseline.  Psychiatric:        Mood and Affect: Mood normal.        Behavior: Behavior normal.        Thought Content: Thought content normal.        Judgment: Judgment normal.     Results for orders placed or performed in visit on 02/03/21  Comp Met (CMET)  Result Value Ref Range   Glucose 114 (H) 70 - 99 mg/dL   BUN 11 6 - 20 mg/dL   Creatinine, Ser 0.85 0.57 - 1.00 mg/dL   eGFR 94 >59 mL/min/1.73   BUN/Creatinine Ratio 13 9 - 23   Sodium 140 134 - 144 mmol/L   Potassium 4.4 3.5 - 5.2 mmol/L   Chloride 103 96 - 106 mmol/L   CO2 20 20 - 29 mmol/L   Calcium 9.5 8.7 - 10.2 mg/dL   Total Protein 6.8 6.0 - 8.5 g/dL   Albumin 4.5 3.9 - 5.0 g/dL   Globulin, Total 2.3 1.5 - 4.5 g/dL   Albumin/Globulin Ratio 2.0 1.2 - 2.2   Bilirubin Total 0.3 0.0 - 1.2 mg/dL    Alkaline Phosphatase 68 44 - 121 IU/L   AST 22 0 - 40 IU/L   ALT 20 0 - 32 IU/L  HgB A1c  Result Value Ref Range   Hgb A1c MFr Bld 6.0 (H) 4.8 - 5.6 %   Est. average glucose Bld gHb Est-mCnc 126 mg/dL  Lipid Profile  Result Value Ref Range   Cholesterol, Total 178 100 - 199 mg/dL   Triglycerides 128 0 - 149 mg/dL   HDL 38 (L) >39 mg/dL   VLDL Cholesterol Cal 23 5 - 40 mg/dL   LDL Chol Calc (NIH) 117 (H) 0 - 99 mg/dL   Chol/HDL Ratio 4.7 (H) 0.0 - 4.4 ratio  Cytology - PAP  Result Value Ref Range   Adequacy      Satisfactory for evaluation; transformation zone component PRESENT.   Diagnosis      - Negative for intraepithelial lesion or malignancy (NILM)      Assessment & Plan:   Problem List Items Addressed This Visit       Other   Obesity - Primary    Recommend a healthy lifestyle through diet and exercise.       Relevant Orders   Comp Met (CMET)   Elevated LDL cholesterol level    Labs ordered today. Will make recommendations based on lab results.       Relevant Orders   Lipid Profile   Prediabetes    Labs ordered today. Will make recommendations based on lab results.       Relevant Orders   HgB A1c   Other Visit Diagnoses     Muscle spasm       Recommend ibuprofen PRN for pain and continue use of heating pad.  Follow up if symptoms worsen or fail to improve.   Need for Tdap vaccination       Relevant Orders   Tdap vaccine greater than or equal to 7yo IM (Completed)        Follow up plan: Return in about 6 months (around 02/04/2022) for Physical and Fasting labs.

## 2021-08-04 ENCOUNTER — Encounter: Payer: Self-pay | Admitting: Nurse Practitioner

## 2021-08-04 ENCOUNTER — Ambulatory Visit (INDEPENDENT_AMBULATORY_CARE_PROVIDER_SITE_OTHER): Payer: BC Managed Care – PPO | Admitting: Nurse Practitioner

## 2021-08-04 VITALS — BP 124/90 | HR 99 | Temp 98.8°F | Ht 66.42 in | Wt 258.0 lb

## 2021-08-04 DIAGNOSIS — E7800 Pure hypercholesterolemia, unspecified: Secondary | ICD-10-CM | POA: Diagnosis not present

## 2021-08-04 DIAGNOSIS — Z23 Encounter for immunization: Secondary | ICD-10-CM

## 2021-08-04 DIAGNOSIS — R7303 Prediabetes: Secondary | ICD-10-CM | POA: Diagnosis not present

## 2021-08-04 DIAGNOSIS — Z6841 Body Mass Index (BMI) 40.0 and over, adult: Secondary | ICD-10-CM

## 2021-08-04 DIAGNOSIS — M62838 Other muscle spasm: Secondary | ICD-10-CM | POA: Diagnosis not present

## 2021-08-04 NOTE — Assessment & Plan Note (Signed)
Labs ordered today.  Will make recommendations based on lab results. ?

## 2021-08-04 NOTE — Assessment & Plan Note (Signed)
Recommend a healthy lifestyle through diet and exercise.  °

## 2021-08-05 LAB — COMPREHENSIVE METABOLIC PANEL
ALT: 22 IU/L (ref 0–32)
AST: 16 IU/L (ref 0–40)
Albumin/Globulin Ratio: 1.9 (ref 1.2–2.2)
Albumin: 4.5 g/dL (ref 4.0–5.0)
Alkaline Phosphatase: 53 IU/L (ref 44–121)
BUN/Creatinine Ratio: 11 (ref 9–23)
BUN: 8 mg/dL (ref 6–20)
Bilirubin Total: 0.2 mg/dL (ref 0.0–1.2)
CO2: 22 mmol/L (ref 20–29)
Calcium: 9.6 mg/dL (ref 8.7–10.2)
Chloride: 103 mmol/L (ref 96–106)
Creatinine, Ser: 0.76 mg/dL (ref 0.57–1.00)
Globulin, Total: 2.4 g/dL (ref 1.5–4.5)
Glucose: 122 mg/dL — ABNORMAL HIGH (ref 70–99)
Potassium: 4.8 mmol/L (ref 3.5–5.2)
Sodium: 139 mmol/L (ref 134–144)
Total Protein: 6.9 g/dL (ref 6.0–8.5)
eGFR: 108 mL/min/{1.73_m2} (ref >59)

## 2021-08-05 LAB — LIPID PANEL
Chol/HDL Ratio: 5.4 ratio — ABNORMAL HIGH (ref 0.0–4.4)
Cholesterol, Total: 184 mg/dL (ref 100–199)
HDL: 34 mg/dL — ABNORMAL LOW (ref >39)
LDL Chol Calc (NIH): 122 mg/dL — ABNORMAL HIGH (ref 0–99)
Triglycerides: 153 mg/dL — ABNORMAL HIGH (ref 0–149)
VLDL Cholesterol Cal: 28 mg/dL (ref 5–40)

## 2021-08-05 LAB — HEMOGLOBIN A1C
Est. average glucose Bld gHb Est-mCnc: 137 mg/dL
Hgb A1c MFr Bld: 6.4 % — ABNORMAL HIGH (ref 4.8–5.6)

## 2021-08-05 NOTE — Progress Notes (Signed)
Please let patient know that her lab work shows that her A1c increased to 6.4. Her cholesterol also increased from prior.  I recommend working on diet and exercise to help control these.  We will continue to monitor at future visits.  Please let me know if she has any questions.

## 2022-01-16 DIAGNOSIS — Z03818 Encounter for observation for suspected exposure to other biological agents ruled out: Secondary | ICD-10-CM | POA: Diagnosis not present

## 2022-01-16 DIAGNOSIS — J101 Influenza due to other identified influenza virus with other respiratory manifestations: Secondary | ICD-10-CM | POA: Diagnosis not present

## 2022-01-16 DIAGNOSIS — B309 Viral conjunctivitis, unspecified: Secondary | ICD-10-CM | POA: Diagnosis not present

## 2022-01-16 DIAGNOSIS — J019 Acute sinusitis, unspecified: Secondary | ICD-10-CM | POA: Diagnosis not present

## 2022-01-16 DIAGNOSIS — J209 Acute bronchitis, unspecified: Secondary | ICD-10-CM | POA: Diagnosis not present

## 2022-02-04 ENCOUNTER — Ambulatory Visit (INDEPENDENT_AMBULATORY_CARE_PROVIDER_SITE_OTHER): Payer: Self-pay | Admitting: Nurse Practitioner

## 2022-02-04 ENCOUNTER — Encounter: Payer: Self-pay | Admitting: Nurse Practitioner

## 2022-02-04 VITALS — BP 124/88 | HR 92 | Temp 98.2°F | Ht 67.8 in | Wt 252.0 lb

## 2022-02-04 DIAGNOSIS — R7303 Prediabetes: Secondary | ICD-10-CM

## 2022-02-04 DIAGNOSIS — E7800 Pure hypercholesterolemia, unspecified: Secondary | ICD-10-CM | POA: Diagnosis not present

## 2022-02-04 DIAGNOSIS — Z6841 Body Mass Index (BMI) 40.0 and over, adult: Secondary | ICD-10-CM

## 2022-02-04 DIAGNOSIS — Z0289 Encounter for other administrative examinations: Secondary | ICD-10-CM

## 2022-02-04 NOTE — Progress Notes (Signed)
BP 124/88   Pulse 92   Temp 98.2 F (36.8 C) (Oral)   Ht 5' 7.8" (1.722 m)   Wt 252 lb (114.3 kg)   LMP 01/15/2022 (Exact Date)   SpO2 93%   BMI 38.54 kg/m    Subjective:    Patient ID: Mackenzie Kirby, female    DOB: 06-01-1990, 32 y.o.   MRN: [id number]  HPI: Mackenzie Kirby is a 32 y.o. female  Chief Complaint  Patient presents with   Prediabetes    Patient presents to clinic for routine follow up. She is doing well.  Recently changed jobs.  Does need form filled out for extra bathroom breaks.  Otherwise, she denies concerns at visit today.    Denies HA, CP, SOB, dizziness, palpitations, visual changes, and lower extremity swelling.   Relevant past medical, surgical, family and social history reviewed and updated as indicated. Interim medical history since our last visit reviewed. Allergies and medications reviewed and updated.  Review of Systems  Eyes:  Negative for visual disturbance.  Respiratory:  Negative for cough, chest tightness and shortness of breath.   Cardiovascular:  Negative for chest pain, palpitations and leg swelling.  Musculoskeletal:  Positive for back pain.  Neurological:  Negative for dizziness and headaches.    Per HPI unless specifically indicated above     Objective:    BP 124/88   Pulse 92   Temp 98.2 F (36.8 C) (Oral)   Ht 5' 7.8" (1.722 m)   Wt 252 lb (114.3 kg)   LMP 01/15/2022 (Exact Date)   SpO2 93%   BMI 38.54 kg/m   Wt Readings from Last 3 Encounters:  02/04/22 252 lb (114.3 kg)  08/04/21 258 lb (117 kg)  02/03/21 238 lb 12.8 oz (108.3 kg)    Physical Exam Vitals and nursing note reviewed.  Constitutional:      General: She is not in acute distress.    Appearance: Normal appearance. She is normal weight. She is not ill-appearing, toxic-appearing or diaphoretic.  HENT:     Head: Normocephalic.     Right Ear: External ear normal.     Left Ear: External ear normal.     Nose: Nose normal.     Mouth/Throat:      Mouth: Mucous membranes are moist.     Pharynx: Oropharynx is clear.  Eyes:     General:        Right eye: No discharge.        Left eye: No discharge.     Extraocular Movements: Extraocular movements intact.     Conjunctiva/sclera: Conjunctivae normal.     Pupils: Pupils are equal, round, and reactive to light.  Cardiovascular:     Rate and Rhythm: Normal rate and regular rhythm.     Heart sounds: No murmur heard. Pulmonary:     Effort: Pulmonary effort is normal. No respiratory distress.     Breath sounds: Normal breath sounds. No wheezing or rales.  Musculoskeletal:     Cervical back: Normal range of motion and neck supple.       Back:  Skin:    General: Skin is warm and dry.     Capillary Refill: Capillary refill takes less than 2 seconds.  Neurological:     General: No focal deficit present.     Mental Status: She is alert and oriented to person, place, and time. Mental status is at baseline.  Psychiatric:        Mood and Affect: Mood  normal.        Behavior: Behavior normal.        Thought Content: Thought content normal.        Judgment: Judgment normal.     Results for orders placed or performed in visit on 08/04/21  Comp Met (CMET)  Result Value Ref Range   Glucose 122 (H) 70 - 99 mg/dL   BUN 8 6 - 20 mg/dL   Creatinine, Ser 0.76 0.57 - 1.00 mg/dL   eGFR 108 >59 mL/min/1.73   BUN/Creatinine Ratio 11 9 - 23   Sodium 139 134 - 144 mmol/L   Potassium 4.8 3.5 - 5.2 mmol/L   Chloride 103 96 - 106 mmol/L   CO2 22 20 - 29 mmol/L   Calcium 9.6 8.7 - 10.2 mg/dL   Total Protein 6.9 6.0 - 8.5 g/dL   Albumin 4.5 4.0 - 5.0 g/dL   Globulin, Total 2.4 1.5 - 4.5 g/dL   Albumin/Globulin Ratio 1.9 1.2 - 2.2   Bilirubin Total 0.2 0.0 - 1.2 mg/dL   Alkaline Phosphatase 53 44 - 121 IU/L   AST 16 0 - 40 IU/L   ALT 22 0 - 32 IU/L  HgB A1c  Result Value Ref Range   Hgb A1c MFr Bld 6.4 (H) 4.8 - 5.6 %   Est. average glucose Bld gHb Est-mCnc 137 mg/dL  Lipid Profile  Result  Value Ref Range   Cholesterol, Total 184 100 - 199 mg/dL   Triglycerides 153 (H) 0 - 149 mg/dL   HDL 34 (L) >39 mg/dL   VLDL Cholesterol Cal 28 5 - 40 mg/dL   LDL Chol Calc (NIH) 122 (H) 0 - 99 mg/dL   Chol/HDL Ratio 5.4 (H) 0.0 - 4.4 ratio      Assessment & Plan:   Problem List Items Addressed This Visit       Other   Obesity - Primary    Recommended eating smaller high protein, low fat meals more frequently and exercising 30 mins a day 5 times a week with a goal of 10-15lb weight loss in the next 3 months.       Relevant Orders   Comprehensive metabolic panel   Elevated LDL cholesterol level    Labs ordered at visit today.  Will make recommendations based on lab results.         Relevant Orders   Comprehensive metabolic panel   Lipid panel   Prediabetes    Labs ordered at visit today.  Will make recommendations based on lab results.        Relevant Orders   Comprehensive metabolic panel   HgB J3H   Other Visit Diagnoses     Encounter for completion of form with patient       Form completed for patient to have additional bathroom breaks at work.        Follow up plan: Return in about 5 months (around 07/06/2022) for Physical and Fasting labs.

## 2022-02-04 NOTE — Assessment & Plan Note (Signed)
Recommended eating smaller high protein, low fat meals more frequently and exercising 30 mins a day 5 times a week with a goal of 10-15lb weight loss in the next 3 months.  

## 2022-02-04 NOTE — Progress Notes (Deleted)
There were no vitals taken for this visit.   Subjective:    Patient ID: Mackenzie Kirby, female    DOB: 11-Dec-1990, 32 y.o.   MRN: [id number]  HPI: Mackenzie Kirby is a 32 y.o. female presenting on 02/04/2022 for comprehensive medical examination. Current medical complaints include:none  She currently lives with: Menopausal Symptoms: no   Denies HA, CP, SOB, dizziness, palpitations, visual changes, and lower extremity swelling.   Depression Screen done today and results listed below:     08/04/2021    8:26 AM 02/03/2021    8:12 AM 06/20/2020    3:25 PM 06/08/2017    4:02 PM  Depression screen PHQ 2/9  Decreased Interest 1 0 0 1  Down, Depressed, Hopeless 0 0 0 1  PHQ - 2 Score 1 0 0 2  Altered sleeping 1 1 0 3  Tired, decreased energy 1 0 0 1  Change in appetite 1 0 0 1  Feeling bad or failure about yourself  1 0 0 0  Trouble concentrating 2 0 0 1  Moving slowly or fidgety/restless 3 0 0 3  Suicidal thoughts 0 0 0 1  PHQ-9 Score 10 1 0 12  Difficult doing work/chores Somewhat difficult Not difficult at all  Somewhat difficult    The patient does not have a history of falls. I did complete a risk assessment for falls. A plan of care for falls was documented.   Past Medical History:  Past Medical History:  Diagnosis Date   Allergic rhinitis    Fibroid    Fibroids     Surgical History:  Past Surgical History:  Procedure Laterality Date   CARPAL TUNNEL RELEASE Right 05/02/2021   WISDOM TOOTH EXTRACTION      Medications:  Current Outpatient Medications on File Prior to Visit  Medication Sig   ibuprofen (ADVIL) 600 MG tablet Take 600 mg by mouth 3 (three) times daily.   No current facility-administered medications on file prior to visit.    Allergies:  No Known Allergies  Social History:  Social History   Socioeconomic History   Marital status: Married    Spouse name: Not on file   Number of children: Not on file   Years of education: Not on file    Highest education level: Not on file  Occupational History   Not on file  Tobacco Use   Smoking status: Former    Packs/day: 0.25    Types: Cigarettes   Smokeless tobacco: Never   Tobacco comments:    pt states she stopped smoking about 4-5 months ago  Vaping Use   Vaping Use: Every day  Substance and Sexual Activity   Alcohol use: Yes    Comment: rare   Drug use: No   Sexual activity: Yes    Birth control/protection: None  Other Topics Concern   Not on file  Social History Narrative   Not on file   Social Determinants of Health   Financial Resource Strain: Not on file  Food Insecurity: Not on file  Transportation Needs: Not on file  Physical Activity: Not on file  Stress: Not on file  Social Connections: Not on file  Intimate Partner Violence: Not on file   Social History   Tobacco Use  Smoking Status Former   Packs/day: 0.25   Types: Cigarettes  Smokeless Tobacco Never  Tobacco Comments   pt states she stopped smoking about 4-5 months ago   Social History   Substance and Sexual  Activity  Alcohol Use Yes   Comment: rare    Family History:  Family History  Problem Relation Age of Onset   Gestational diabetes Mother     Past medical history, surgical history, medications, allergies, family history and social history reviewed with patient today and changes made to appropriate areas of the chart.   Review of Systems  Eyes:  Negative for blurred vision and double vision.  Respiratory:  Negative for shortness of breath.   Cardiovascular:  Negative for chest pain, palpitations and leg swelling.  Neurological:  Negative for dizziness and headaches.   All other ROS negative except what is listed above and in the HPI.      Objective:    There were no vitals taken for this visit.  Wt Readings from Last 3 Encounters:  08/04/21 258 lb (117 kg)  02/03/21 238 lb 12.8 oz (108.3 kg)  01/30/21 233 lb 14.5 oz (106.1 kg)    Physical Exam Vitals and nursing  note reviewed.  Constitutional:      General: She is awake. She is not in acute distress.    Appearance: She is well-developed. She is obese. She is not ill-appearing.  HENT:     Head: Normocephalic and atraumatic.     Right Ear: Hearing, tympanic membrane, ear canal and external ear normal. No drainage.     Left Ear: Hearing, tympanic membrane, ear canal and external ear normal. No drainage.     Nose: Nose normal.     Right Sinus: No maxillary sinus tenderness or frontal sinus tenderness.     Left Sinus: No maxillary sinus tenderness or frontal sinus tenderness.     Mouth/Throat:     Mouth: Mucous membranes are moist.     Pharynx: Oropharynx is clear. Uvula midline. No pharyngeal swelling, oropharyngeal exudate or posterior oropharyngeal erythema.  Eyes:     General: Lids are normal.        Right eye: No discharge.        Left eye: No discharge.     Extraocular Movements: Extraocular movements intact.     Conjunctiva/sclera: Conjunctivae normal.     Pupils: Pupils are equal, round, and reactive to light.     Visual Fields: Right eye visual fields normal and left eye visual fields normal.  Neck:     Thyroid: No thyromegaly.     Vascular: No carotid bruit.     Trachea: Trachea normal.  Cardiovascular:     Rate and Rhythm: Normal rate and regular rhythm.     Heart sounds: Normal heart sounds. No murmur heard.    No gallop.  Pulmonary:     Effort: Pulmonary effort is normal. No accessory muscle usage or respiratory distress.     Breath sounds: Normal breath sounds.  Chest:  Breasts:    Right: Normal.     Left: Normal.  Abdominal:     General: Bowel sounds are normal.     Palpations: Abdomen is soft. There is no hepatomegaly or splenomegaly.     Tenderness: There is no abdominal tenderness.  Musculoskeletal:        General: Normal range of motion.     Cervical back: Normal range of motion and neck supple.     Right lower leg: No edema.     Left lower leg: No edema.   Lymphadenopathy:     Head:     Right side of head: No submental, submandibular, tonsillar, preauricular or posterior auricular adenopathy.     Left  side of head: No submental, submandibular, tonsillar, preauricular or posterior auricular adenopathy.     Cervical: No cervical adenopathy.     Upper Body:     Right upper body: No supraclavicular, axillary or pectoral adenopathy.     Left upper body: No supraclavicular, axillary or pectoral adenopathy.  Skin:    General: Skin is warm and dry.     Capillary Refill: Capillary refill takes less than 2 seconds.     Findings: No rash.  Neurological:     Mental Status: She is alert and oriented to person, place, and time.     Gait: Gait is intact.     Deep Tendon Reflexes: Reflexes are normal and symmetric.     Reflex Scores:      Brachioradialis reflexes are 2+ on the right side and 2+ on the left side.      Patellar reflexes are 2+ on the right side and 2+ on the left side. Psychiatric:        Attention and Perception: Attention normal.        Mood and Affect: Mood normal.        Speech: Speech normal.        Behavior: Behavior normal. Behavior is cooperative.        Thought Content: Thought content normal.        Judgment: Judgment normal.     Results for orders placed or performed in visit on 08/04/21  Comp Met (CMET)  Result Value Ref Range   Glucose 122 (H) 70 - 99 mg/dL   BUN 8 6 - 20 mg/dL   Creatinine, Ser 0.76 0.57 - 1.00 mg/dL   eGFR 108 >59 mL/min/1.73   BUN/Creatinine Ratio 11 9 - 23   Sodium 139 134 - 144 mmol/L   Potassium 4.8 3.5 - 5.2 mmol/L   Chloride 103 96 - 106 mmol/L   CO2 22 20 - 29 mmol/L   Calcium 9.6 8.7 - 10.2 mg/dL   Total Protein 6.9 6.0 - 8.5 g/dL   Albumin 4.5 4.0 - 5.0 g/dL   Globulin, Total 2.4 1.5 - 4.5 g/dL   Albumin/Globulin Ratio 1.9 1.2 - 2.2   Bilirubin Total 0.2 0.0 - 1.2 mg/dL   Alkaline Phosphatase 53 44 - 121 IU/L   AST 16 0 - 40 IU/L   ALT 22 0 - 32 IU/L  HgB A1c  Result Value Ref  Range   Hgb A1c MFr Bld 6.4 (H) 4.8 - 5.6 %   Est. average glucose Bld gHb Est-mCnc 137 mg/dL  Lipid Profile  Result Value Ref Range   Cholesterol, Total 184 100 - 199 mg/dL   Triglycerides 153 (H) 0 - 149 mg/dL   HDL 34 (L) >39 mg/dL   VLDL Cholesterol Cal 28 5 - 40 mg/dL   LDL Chol Calc (NIH) 122 (H) 0 - 99 mg/dL   Chol/HDL Ratio 5.4 (H) 0.0 - 4.4 ratio      Assessment & Plan:   Problem List Items Addressed This Visit       Endocrine   IFG (impaired fasting glucose)     Other   Obesity   Elevated LDL cholesterol level   Prediabetes   Other Visit Diagnoses     Annual physical exam    -  Primary        Follow up plan: No follow-ups on file.   LABORATORY TESTING:  - Pap smear: up to date  IMMUNIZATIONS:   - Tdap: Tetanus vaccination status  reviewed: last tetanus booster within 10 years. - Influenza: Postponed to flu season - Pneumovax: Not applicable - Prevnar: Not applicable - HPV: Up to date - Zostavax vaccine: Not applicable  SCREENING: -Mammogram: Not applicable  - Colonoscopy: Not applicable  - Bone Density: Not applicable  -Hearing Test: Not applicable  -Spirometry: Not applicable   PATIENT COUNSELING:   Advised to take 1 mg of folate supplement per day if capable of pregnancy.   Sexuality: Discussed sexually transmitted diseases, partner selection, use of condoms, avoidance of unintended pregnancy  and contraceptive alternatives.   Advised to avoid cigarette smoking.  I discussed with the patient that most people either abstain from alcohol or drink within safe limits (<=14/week and <=4 drinks/occasion for males, <=7/weeks and <= 3 drinks/occasion for females) and that the risk for alcohol disorders and other health effects rises proportionally with the number of drinks per week and how often a drinker exceeds daily limits.  Discussed cessation/primary prevention of drug use and availability of treatment for abuse.   Diet: Encouraged to adjust  caloric intake to maintain  or achieve ideal body weight, to reduce intake of dietary saturated fat and total fat, to limit sodium intake by avoiding high sodium foods and not adding table salt, and to maintain adequate dietary potassium and calcium preferably from fresh fruits, vegetables, and low-fat dairy products.    stressed the importance of regular exercise  Injury prevention: Discussed safety belts, safety helmets, smoke detector, smoking near bedding or upholstery.   Dental health: Discussed importance of regular tooth brushing, flossing, and dental visits.    NEXT PREVENTATIVE PHYSICAL DUE IN 1 YEAR. No follow-ups on file.

## 2022-02-04 NOTE — Assessment & Plan Note (Signed)
Labs ordered at visit today.  Will make recommendations based on lab results.   

## 2022-02-05 LAB — COMPREHENSIVE METABOLIC PANEL
ALT: 23 IU/L (ref 0–32)
AST: 20 IU/L (ref 0–40)
Albumin/Globulin Ratio: 2.3 — ABNORMAL HIGH (ref 1.2–2.2)
Albumin: 4.3 g/dL (ref 3.9–4.9)
Alkaline Phosphatase: 55 IU/L (ref 44–121)
BUN/Creatinine Ratio: 8 — ABNORMAL LOW (ref 9–23)
BUN: 6 mg/dL (ref 6–20)
Bilirubin Total: 0.3 mg/dL (ref 0.0–1.2)
CO2: 19 mmol/L — ABNORMAL LOW (ref 20–29)
Calcium: 9 mg/dL (ref 8.7–10.2)
Chloride: 104 mmol/L (ref 96–106)
Creatinine, Ser: 0.78 mg/dL (ref 0.57–1.00)
Globulin, Total: 1.9 g/dL (ref 1.5–4.5)
Glucose: 99 mg/dL (ref 70–99)
Potassium: 4.4 mmol/L (ref 3.5–5.2)
Sodium: 138 mmol/L (ref 134–144)
Total Protein: 6.2 g/dL (ref 6.0–8.5)
eGFR: 104 mL/min/{1.73_m2} (ref >59)

## 2022-02-05 LAB — LIPID PANEL
Chol/HDL Ratio: 4.4 ratio (ref 0.0–4.4)
Cholesterol, Total: 177 mg/dL (ref 100–199)
HDL: 40 mg/dL (ref >39)
LDL Chol Calc (NIH): 117 mg/dL — ABNORMAL HIGH (ref 0–99)
Triglycerides: 109 mg/dL (ref 0–149)
VLDL Cholesterol Cal: 20 mg/dL (ref 5–40)

## 2022-02-05 LAB — HEMOGLOBIN A1C
Est. average glucose Bld gHb Est-mCnc: 140 mg/dL
Hgb A1c MFr Bld: 6.5 % — ABNORMAL HIGH (ref 4.8–5.6)

## 2022-02-05 NOTE — Progress Notes (Signed)
Please let patient know that her lab work shows that her cholesterol improved from prior. Her A1c did increase to the diabetic range.  I would like her to come back and discuss this further.  Her cholesterol improved from prior.  Keep up with the exercise program. No other concerns at this time.

## 2022-02-24 NOTE — Progress Notes (Unsigned)
   LMP 01/15/2022 (Exact Date)    Subjective:    Patient ID: Mackenzie Kirby, female    DOB: 1990/11/09, 32 y.o.   MRN: [id number]  HPI: Mackenzie Kirby is a 32 y.o. female  No chief complaint on file.  DIABETES Hypoglycemic episodes:{Blank single:19197::"yes","no"} Polydipsia/polyuria: {Blank single:19197::"yes","no"} Visual disturbance: {Blank single:19197::"yes","no"} Chest pain: {Blank single:19197::"yes","no"} Paresthesias: {Blank single:19197::"yes","no"} Glucose Monitoring: {Blank single:19197::"yes","no"}  Accucheck frequency: {Blank single:19197::"Not Checking","Daily","BID","TID"}  Fasting glucose:  Post prandial:  Evening:  Before meals: Taking Insulin?: {Blank single:19197::"yes","no"}  Long acting insulin:  Short acting insulin: Blood Pressure Monitoring: {Blank single:19197::"not checking","rarely","daily","weekly","monthly","a few times a day","a few times a week","a few times a month"} Retinal Examination: {Blank single:19197::"Up to Date","Not up to Date"} Foot Exam: {Blank single:19197::"Up to Date","Not up to Date"} Diabetic Education: {Blank single:19197::"Completed","Not Completed"} Pneumovax: {Blank single:19197::"Up to Date","Not up to Date","unknown"} Influenza: {Blank single:19197::"Up to Date","Not up to Date","unknown"} Aspirin: {Blank single:19197::"yes","no"}  Relevant past medical, surgical, family and social history reviewed and updated as indicated. Interim medical history since our last visit reviewed. Allergies and medications reviewed and updated.  Review of Systems  Per HPI unless specifically indicated above     Objective:    LMP 01/15/2022 (Exact Date)   Wt Readings from Last 3 Encounters:  02/04/22 252 lb (114.3 kg)  08/04/21 258 lb (117 kg)  02/03/21 238 lb 12.8 oz (108.3 kg)    Physical Exam  Results for orders placed or performed in visit on 02/04/22  Comprehensive metabolic panel  Result Value Ref Range   Glucose 99 70 -  99 mg/dL   BUN 6 6 - 20 mg/dL   Creatinine, Ser 0.78 0.57 - 1.00 mg/dL   eGFR 104 >59 mL/min/1.73   BUN/Creatinine Ratio 8 (L) 9 - 23   Sodium 138 134 - 144 mmol/L   Potassium 4.4 3.5 - 5.2 mmol/L   Chloride 104 96 - 106 mmol/L   CO2 19 (L) 20 - 29 mmol/L   Calcium 9.0 8.7 - 10.2 mg/dL   Total Protein 6.2 6.0 - 8.5 g/dL   Albumin 4.3 3.9 - 4.9 g/dL   Globulin, Total 1.9 1.5 - 4.5 g/dL   Albumin/Globulin Ratio 2.3 (H) 1.2 - 2.2   Bilirubin Total 0.3 0.0 - 1.2 mg/dL   Alkaline Phosphatase 55 44 - 121 IU/L   AST 20 0 - 40 IU/L   ALT 23 0 - 32 IU/L  Lipid panel  Result Value Ref Range   Cholesterol, Total 177 100 - 199 mg/dL   Triglycerides 109 0 - 149 mg/dL   HDL 40 >39 mg/dL   VLDL Cholesterol Cal 20 5 - 40 mg/dL   LDL Chol Calc (NIH) 117 (H) 0 - 99 mg/dL   Chol/HDL Ratio 4.4 0.0 - 4.4 ratio  HgB A1c  Result Value Ref Range   Hgb A1c MFr Bld 6.5 (H) 4.8 - 5.6 %   Est. average glucose Bld gHb Est-mCnc 140 mg/dL      Assessment & Plan:   Problem List Items Addressed This Visit   None    Follow up plan: No follow-ups on file.

## 2022-02-25 ENCOUNTER — Encounter: Payer: Self-pay | Admitting: Nurse Practitioner

## 2022-02-25 ENCOUNTER — Ambulatory Visit (INDEPENDENT_AMBULATORY_CARE_PROVIDER_SITE_OTHER): Payer: Self-pay | Admitting: Nurse Practitioner

## 2022-02-25 VITALS — BP 130/80 | HR 94 | Temp 98.9°F | Wt 256.2 lb

## 2022-02-25 DIAGNOSIS — E118 Type 2 diabetes mellitus with unspecified complications: Secondary | ICD-10-CM

## 2022-02-25 DIAGNOSIS — E119 Type 2 diabetes mellitus without complications: Secondary | ICD-10-CM | POA: Insufficient documentation

## 2022-02-25 NOTE — Assessment & Plan Note (Signed)
Newly diagnosed.  Discussed medications, statins, ACE/ARB.  Patient is trying to get pregnant so they are not an option right now.  Continue with diet and exercise.  Follow up in 5 months.

## 2022-07-24 ENCOUNTER — Encounter: Payer: Self-pay | Admitting: Nurse Practitioner

## 2022-07-24 ENCOUNTER — Ambulatory Visit (INDEPENDENT_AMBULATORY_CARE_PROVIDER_SITE_OTHER): Payer: BC Managed Care – PPO | Admitting: Nurse Practitioner

## 2022-07-24 VITALS — BP 138/81 | HR 83 | Temp 98.6°F | Wt 256.6 lb

## 2022-07-24 DIAGNOSIS — E118 Type 2 diabetes mellitus with unspecified complications: Secondary | ICD-10-CM

## 2022-07-24 DIAGNOSIS — E7800 Pure hypercholesterolemia, unspecified: Secondary | ICD-10-CM

## 2022-07-24 DIAGNOSIS — Z6841 Body Mass Index (BMI) 40.0 and over, adult: Secondary | ICD-10-CM

## 2022-07-24 MED ORDER — LISINOPRIL 2.5 MG PO TABS: 2.5000 mg | ORAL_TABLET | Freq: Every day | ORAL | 1 refills | Status: DC

## 2022-07-24 MED ORDER — ROSUVASTATIN CALCIUM 5 MG PO TABS: 5.0000 mg | ORAL_TABLET | Freq: Every day | ORAL | 1 refills | Status: DC

## 2022-07-24 MED ORDER — OZEMPIC (0.25 OR 0.5 MG/DOSE) 2 MG/1.5ML ~~LOC~~ SOPN: 0.2500 mg | PEN_INJECTOR | SUBCUTANEOUS | 0 refills | Status: DC

## 2022-07-24 MED ORDER — TRIAMCINOLONE ACETONIDE 0.1 % EX CREA: 1.0000 | TOPICAL_CREAM | Freq: Two times a day (BID) | CUTANEOUS | 1 refills | Status: DC

## 2022-07-24 NOTE — Progress Notes (Signed)
BP 138/81   Pulse 83   Temp 98.6 F (37 C) (Oral)   Wt 256 lb 9.6 oz (116.4 kg)   BMI 39.25 kg/m    Subjective:    Patient ID: Mackenzie Kirby, female    DOB: 1990-03-05, 32 y.o.   MRN: [id number]  HPI: Mackenzie Kirby is a 32 y.o. female  Chief Complaint  Patient presents with   Diabetes   DIABETES Patient has been diet controlled.  Would like to go on Ozempic to help with diabetes control and weight loss.  Does not currently check sugars.  Okay with starting ACE and Statin.  Denies HA, CP, SOB, dizziness, palpitations, visual changes, and lower extremity swelling.   Relevant past medical, surgical, family and social history reviewed and updated as indicated. Interim medical history since our last visit reviewed. Allergies and medications reviewed and updated.  Review of Systems  Eyes:  Negative for visual disturbance.  Respiratory:  Negative for cough, chest tightness and shortness of breath.   Cardiovascular:  Negative for chest pain, palpitations and leg swelling.  Neurological:  Negative for dizziness and headaches.    Per HPI unless specifically indicated above     Objective:    BP 138/81   Pulse 83   Temp 98.6 F (37 C) (Oral)   Wt 256 lb 9.6 oz (116.4 kg)   BMI 39.25 kg/m   Wt Readings from Last 3 Encounters:  07/24/22 256 lb 9.6 oz (116.4 kg)  02/25/22 256 lb 3.2 oz (116.2 kg)  02/04/22 252 lb (114.3 kg)    Physical Exam Vitals and nursing note reviewed.  Constitutional:      General: She is not in acute distress.    Appearance: Normal appearance. She is obese. She is not ill-appearing, toxic-appearing or diaphoretic.  HENT:     Head: Normocephalic.     Right Ear: External ear normal.     Left Ear: External ear normal.     Nose: Nose normal.     Mouth/Throat:     Mouth: Mucous membranes are moist.     Pharynx: Oropharynx is clear.  Eyes:     General:        Right eye: No discharge.        Left eye: No discharge.     Extraocular Movements:  Extraocular movements intact.     Conjunctiva/sclera: Conjunctivae normal.     Pupils: Pupils are equal, round, and reactive to light.  Cardiovascular:     Rate and Rhythm: Normal rate and regular rhythm.     Heart sounds: No murmur heard. Pulmonary:     Effort: Pulmonary effort is normal. No respiratory distress.     Breath sounds: Normal breath sounds. No wheezing or rales.  Musculoskeletal:     Cervical back: Normal range of motion and neck supple.  Skin:    General: Skin is warm and dry.     Capillary Refill: Capillary refill takes less than 2 seconds.  Neurological:     General: No focal deficit present.     Mental Status: She is alert and oriented to person, place, and time. Mental status is at baseline.  Psychiatric:        Mood and Affect: Mood normal.        Behavior: Behavior normal.        Thought Content: Thought content normal.        Judgment: Judgment normal.     Results for orders placed or performed in visit on  02/04/22  Comprehensive metabolic panel  Result Value Ref Range   Glucose 99 70 - 99 mg/dL   BUN 6 6 - 20 mg/dL   Creatinine, Ser 1.61 0.57 - 1.00 mg/dL   eGFR 096 >04 VW/UJW/1.19   BUN/Creatinine Ratio 8 (L) 9 - 23   Sodium 138 134 - 144 mmol/L   Potassium 4.4 3.5 - 5.2 mmol/L   Chloride 104 96 - 106 mmol/L   CO2 19 (L) 20 - 29 mmol/L   Calcium 9.0 8.7 - 10.2 mg/dL   Total Protein 6.2 6.0 - 8.5 g/dL   Albumin 4.3 3.9 - 4.9 g/dL   Globulin, Total 1.9 1.5 - 4.5 g/dL   Albumin/Globulin Ratio 2.3 (H) 1.2 - 2.2   Bilirubin Total 0.3 0.0 - 1.2 mg/dL   Alkaline Phosphatase 55 44 - 121 IU/L   AST 20 0 - 40 IU/L   ALT 23 0 - 32 IU/L  Lipid panel  Result Value Ref Range   Cholesterol, Total 177 100 - 199 mg/dL   Triglycerides 147 0 - 149 mg/dL   HDL 40 >82 mg/dL   VLDL Cholesterol Cal 20 5 - 40 mg/dL   LDL Chol Calc (NIH) 956 (H) 0 - 99 mg/dL   Chol/HDL Ratio 4.4 0.0 - 4.4 ratio  HgB A1c  Result Value Ref Range   Hgb A1c MFr Bld 6.5 (H) 4.8 - 5.6  %   Est. average glucose Bld gHb Est-mCnc 140 mg/dL      Assessment & Plan:   Problem List Items Addressed This Visit       Endocrine   Diabetes type 2, controlled (HCC) - Primary    Chronic.  Controlled at this time.  Will start Ozempic 0.25mg  weekly.  Discussed how to titrate medication. Discussed side effects and benefits of medication.  Discussed how to give injection. Will also start Lisinopril 2.5mg  daily to help with kidney protection and Crestor 5mg  for cardio protection.  Follow up in 6 weeks for follow up on medication.       Relevant Medications   Semaglutide,0.25 or 0.5MG /DOS, (OZEMPIC, 0.25 OR 0.5 MG/DOSE,) 2 MG/1.5ML SOPN   lisinopril (ZESTRIL) 2.5 MG tablet   rosuvastatin (CRESTOR) 5 MG tablet   Other Relevant Orders   Comp Met (CMET)   HgB A1c   Microalbumin, Urine Waived     Other   Obesity    Recommended eating smaller high protein, low fat meals more frequently and exercising 30 mins a day 5 times a week with a goal of 10-15lb weight loss in the next 3 months.       Relevant Medications   Semaglutide,0.25 or 0.5MG /DOS, (OZEMPIC, 0.25 OR 0.5 MG/DOSE,) 2 MG/1.5ML SOPN   Elevated LDL cholesterol level    Labs ordered at visit today.  Will make recommendations based on lab results.        Relevant Orders   Lipid Profile     Follow up plan: Return in about 6 months (around 01/23/2023) for Physical and Fasting labs.

## 2022-07-24 NOTE — Assessment & Plan Note (Signed)
Chronic.  Controlled at this time.  Will start Ozempic 0.25mg  weekly.  Discussed how to titrate medication. Discussed side effects and benefits of medication.  Discussed how to give injection. Will also start Lisinopril 2.5mg  daily to help with kidney protection and Crestor 5mg  for cardio protection.  Follow up in 6 weeks for follow up on medication.

## 2022-07-24 NOTE — Assessment & Plan Note (Signed)
Labs ordered at visit today.  Will make recommendations based on lab results.   

## 2022-07-24 NOTE — Assessment & Plan Note (Signed)
Recommended eating smaller high protein, low fat meals more frequently and exercising 30 mins a day 5 times a week with a goal of 10-15lb weight loss in the next 3 months.  

## 2022-07-25 LAB — COMPREHENSIVE METABOLIC PANEL
ALT: 24 IU/L (ref 0–32)
AST: 20 IU/L (ref 0–40)
Albumin: 4.4 g/dL (ref 3.9–4.9)
Alkaline Phosphatase: 72 IU/L (ref 44–121)
BUN/Creatinine Ratio: 10 (ref 9–23)
BUN: 8 mg/dL (ref 6–20)
Bilirubin Total: <0.2 mg/dL (ref 0.0–1.2)
CO2: 22 mmol/L (ref 20–29)
Calcium: 9.4 mg/dL (ref 8.7–10.2)
Chloride: 103 mmol/L (ref 96–106)
Creatinine, Ser: 0.82 mg/dL (ref 0.57–1.00)
Globulin, Total: 2.4 g/dL (ref 1.5–4.5)
Glucose: 87 mg/dL (ref 70–99)
Potassium: 4.1 mmol/L (ref 3.5–5.2)
Sodium: 139 mmol/L (ref 134–144)
Total Protein: 6.8 g/dL (ref 6.0–8.5)
eGFR: 98 mL/min/{1.73_m2} (ref >59)

## 2022-07-25 LAB — LIPID PANEL
Chol/HDL Ratio: 4.4 ratio (ref 0.0–4.4)
Cholesterol, Total: 173 mg/dL (ref 100–199)
HDL: 39 mg/dL — ABNORMAL LOW (ref >39)
LDL Chol Calc (NIH): 101 mg/dL — ABNORMAL HIGH (ref 0–99)
Triglycerides: 188 mg/dL — ABNORMAL HIGH (ref 0–149)
VLDL Cholesterol Cal: 33 mg/dL (ref 5–40)

## 2022-07-25 LAB — HEMOGLOBIN A1C
Est. average glucose Bld gHb Est-mCnc: 151 mg/dL
Hgb A1c MFr Bld: 6.9 % — ABNORMAL HIGH (ref 4.8–5.6)

## 2022-07-27 NOTE — Progress Notes (Signed)
Please let patient know that her lab work looks good.  Her A1c did increase to 6.9%.  I think the plan we discussed during the visit of starting ozempic and the cholesterol medication will benefit her well.  No concerns at this time.  Continue with current medication regimen.  Follow up as discussed.

## 2022-07-28 ENCOUNTER — Telehealth: Payer: Self-pay

## 2022-07-28 NOTE — Telephone Encounter (Signed)
PA for Ozempic initiated and submitted via Cover My Meds. Key: [id number]

## 2022-08-04 NOTE — Telephone Encounter (Signed)
PA approved.

## 2022-09-04 ENCOUNTER — Ambulatory Visit: Payer: BC Managed Care – PPO | Admitting: Physician Assistant

## 2022-09-04 ENCOUNTER — Encounter: Payer: Self-pay | Admitting: Physician Assistant

## 2022-09-04 VITALS — BP 124/86 | HR 94 | Ht 67.0 in | Wt 258.8 lb

## 2022-09-04 DIAGNOSIS — Z7985 Long-term (current) use of injectable non-insulin antidiabetic drugs: Secondary | ICD-10-CM

## 2022-09-04 DIAGNOSIS — E118 Type 2 diabetes mellitus with unspecified complications: Secondary | ICD-10-CM | POA: Diagnosis not present

## 2022-09-04 MED ORDER — OZEMPIC (0.25 OR 0.5 MG/DOSE) 2 MG/1.5ML ~~LOC~~ SOPN: 0.5000 mg | PEN_INJECTOR | SUBCUTANEOUS | 1 refills | Status: DC

## 2022-09-04 NOTE — Assessment & Plan Note (Signed)
Chronic, historic condition Most recent A1c was 6.9- too soon to recheck today Will have her come back for lab apt to get Bayer A1c completed in about a month She reports she has been doing okay with Ozempic and has moved up to 0.5 mg weekly injection  She reports side effects have stabilized at this time  We reviewed dietary changes to help wit nausea and heartburn while taking the medication as well as injection site concerns Continue with Ozempic 0.5 mg weekly injection for now- will reassess once A1c has been checked Reviewed taking her Lisinopril 2.5 mg PO every day and Crestor 5 mg PO every day - she reports some difficulty with remembering to take daily.  Refills provided for Ozempic today- recommend she keeps upcoming follow up or comes in sooner as dictated by A1c

## 2022-09-04 NOTE — Progress Notes (Signed)
Established Office Visit    Patient: Mackenzie Kirby   DOB: 11/05/1990   31 y.o. Female  MRN: [id number] Visit Date: 09/04/2022  Today's healthcare provider: Oswaldo Conroy Semir Brill, PA-C  Introduced myself to the patient as a Secondary school teacher and provided education on APPs in clinical practice.    Chief Complaint  Patient presents with   Weight Check    Patient is requesting a refill on Ozempic. Patient says she is doing well. Patient says yesterday was her second dose of the 0.5 mg.    Subjective    HPI HPI     Weight Check    Additional comments: Patient is requesting a refill on Ozempic. Patient says she is doing well. Patient says yesterday was her second dose of the 0.5 mg.       Last edited by Malen Gauze, CMA on 09/04/2022  9:07 AM.       Medication Follow up - Ozempic   She reports her clothing is feeling looser but she admits she does not think she has lost much weight yet  She has noticed she feels sick when she eats bread so she has been reducing this  She reports she has been having some issues remembering to take her Cholesterol and BP medication  She states she took her last dose of her Ozempic yesterday  She reports she does have some nausea the first day or so after injections and sometimes a headache but this has gotten better as she has been taking medication for longer   Medications: Outpatient Medications Prior to Visit  Medication Sig   lisinopril (ZESTRIL) 2.5 MG tablet Take 1 tablet (2.5 mg total) by mouth daily.   rosuvastatin (CRESTOR) 5 MG tablet Take 1 tablet (5 mg total) by mouth daily.   triamcinolone cream (KENALOG) 0.1 % Apply 1 Application topically 2 (two) times daily.   [DISCONTINUED] Semaglutide,0.25 or 0.5MG /DOS, (OZEMPIC, 0.25 OR 0.5 MG/DOSE,) 2 MG/1.5ML SOPN Inject 0.25 mg into the skin once a week. Start with 0.25MG  once a week x 4 weeks, then increase to 0.5MG  weekly.   No facility-administered medications prior to visit.    Review of  Systems  Gastrointestinal:  Negative for abdominal pain, diarrhea, nausea and vomiting.  Neurological:  Negative for dizziness, light-headedness and headaches.        Objective    BP 124/86   Pulse 94   Ht 5\' 7"  (1.702 m)   Wt 258 lb 12.8 oz (117.4 kg)   SpO2 97%   BMI 40.53 kg/m     Physical Exam Vitals reviewed.  Constitutional:      General: She is awake.     Appearance: Normal appearance. She is well-developed and well-groomed.  HENT:     Head: Normocephalic and atraumatic.  Pulmonary:     Effort: Pulmonary effort is normal.  Neurological:     Mental Status: She is alert.  Psychiatric:        Attention and Perception: Attention and perception normal.        Mood and Affect: Mood and affect normal.        Speech: Speech normal.        Behavior: Behavior normal. Behavior is cooperative.       No results found for any visits on 09/04/22.  Assessment & Plan      No follow-ups on file.      Problem List Items Addressed This Visit  Endocrine   Diabetes type 2, controlled (HCC) - Primary    Chronic, historic condition Most recent A1c was 6.9- too soon to recheck today Will have her come back for lab apt to get Bayer A1c completed in about a month She reports she has been doing okay with Ozempic and has moved up to 0.5 mg weekly injection  She reports side effects have stabilized at this time  We reviewed dietary changes to help wit nausea and heartburn while taking the medication as well as injection site concerns Continue with Ozempic 0.5 mg weekly injection for now- will reassess once A1c has been checked Reviewed taking her Lisinopril 2.5 mg PO every day and Crestor 5 mg PO every day - she reports some difficulty with remembering to take daily.  Refills provided for Ozempic today- recommend she keeps upcoming follow up or comes in sooner as dictated by A1c      Relevant Medications   Semaglutide,0.25 or 0.5MG /DOS, (OZEMPIC, 0.25 OR 0.5 MG/DOSE,) 2  MG/1.5ML SOPN   Other Relevant Orders   Bayer DCA Hb A1c Waived (STAT)     No follow-ups on file.   I, Comer Devins E Bradley Bostelman, PA-C, have reviewed all documentation for this visit. The documentation on 09/04/22 for the exam, diagnosis, procedures, and orders are all accurate and complete.   Jacquelin Hawking, MHS, PA-C [facility]

## 2022-10-02 ENCOUNTER — Other Ambulatory Visit: Payer: BC Managed Care – PPO

## 2022-10-02 DIAGNOSIS — E118 Type 2 diabetes mellitus with unspecified complications: Secondary | ICD-10-CM

## 2022-10-02 LAB — BAYER DCA HB A1C WAIVED: HB A1C (BAYER DCA - WAIVED): 6.1 % — ABNORMAL HIGH (ref 4.8–5.6)

## 2022-10-07 NOTE — Progress Notes (Signed)
Your A1c was 6.1 which is still within goal. Please continue with your current regimen at this time and continue taking the Ozempic 0.5 mg weekly injection for management. We will see you in Dec for your follow up or sooner if you have any concerns prior.

## 2022-10-08 ENCOUNTER — Encounter: Payer: Self-pay | Admitting: Nurse Practitioner

## 2022-10-13 ENCOUNTER — Ambulatory Visit: Payer: BC Managed Care – PPO | Admitting: Physician Assistant

## 2022-10-13 ENCOUNTER — Telehealth: Payer: Self-pay | Admitting: Nurse Practitioner

## 2022-10-13 NOTE — Telephone Encounter (Signed)
Patient called stated she does not have anymore refills and will not be seen until Dec. She picked up her script on last week but pharmacy is saying she does not have anymore refills. Patient is very confuse on the instructions. Please f/u pharmacy to clear up the instructions and refills

## 2022-10-14 ENCOUNTER — Other Ambulatory Visit: Payer: Self-pay | Admitting: Physician Assistant

## 2022-10-14 ENCOUNTER — Encounter: Payer: Self-pay | Admitting: Physician Assistant

## 2022-10-14 DIAGNOSIS — E118 Type 2 diabetes mellitus with unspecified complications: Secondary | ICD-10-CM

## 2022-10-14 MED ORDER — SEMAGLUTIDE(0.25 OR 0.5MG/DOS) 2 MG/3ML ~~LOC~~ SOPN: 0.5000 mg | PEN_INJECTOR | SUBCUTANEOUS | 2 refills | Status: DC

## 2022-11-10 NOTE — Progress Notes (Unsigned)
   There were no vitals taken for this visit.   Subjective:    Patient ID: Mackenzie Kirby, female    DOB: 1990/03/16, 32 y.o.   MRN: [id number]  HPI: Mackenzie Kirby is a 32 y.o. female  No chief complaint on file.  DIABETES Hypoglycemic episodes:{Blank single:19197::"yes","no"} Polydipsia/polyuria: {Blank single:19197::"yes","no"} Visual disturbance: {Blank single:19197::"yes","no"} Chest pain: {Blank single:19197::"yes","no"} Paresthesias: {Blank single:19197::"yes","no"} Glucose Monitoring: {Blank single:19197::"yes","no"}  Accucheck frequency: {Blank single:19197::"Not Checking","Daily","BID","TID"}  Fasting glucose:  Post prandial:  Evening:  Before meals: Taking Insulin?: {Blank single:19197::"yes","no"}  Long acting insulin:  Short acting insulin: Blood Pressure Monitoring: {Blank single:19197::"not checking","rarely","daily","weekly","monthly","a few times a day","a few times a week","a few times a month"} Retinal Examination: {Blank single:19197::"Up to Date","Not up to Date"} Foot Exam: {Blank single:19197::"Up to Date","Not up to Date"} Diabetic Education: {Blank single:19197::"Completed","Not Completed"} Pneumovax: {Blank single:19197::"Up to Date","Not up to Date","unknown"} Influenza: {Blank single:19197::"Up to Date","Not up to Date","unknown"} Aspirin: {Blank single:19197::"yes","no"}  Relevant past medical, surgical, family and social history reviewed and updated as indicated. Interim medical history since our last visit reviewed. Allergies and medications reviewed and updated.  Review of Systems  Per HPI unless specifically indicated above     Objective:    There were no vitals taken for this visit.  Wt Readings from Last 3 Encounters:  09/04/22 258 lb 12.8 oz (117.4 kg)  07/24/22 256 lb 9.6 oz (116.4 kg)  02/25/22 256 lb 3.2 oz (116.2 kg)    Physical Exam  Results for orders placed or performed in visit on 10/02/22  Bayer DCA Hb A1c Waived (STAT)   Result Value Ref Range   HB A1C (BAYER DCA - WAIVED) 6.1 (H) 4.8 - 5.6 %      Assessment & Plan:   Problem List Items Addressed This Visit       Endocrine   Diabetes type 2, controlled (HCC) - Primary     Follow up plan: No follow-ups on file.

## 2022-11-11 ENCOUNTER — Ambulatory Visit (INDEPENDENT_AMBULATORY_CARE_PROVIDER_SITE_OTHER): Payer: BC Managed Care – PPO | Admitting: Nurse Practitioner

## 2022-11-11 VITALS — BP 138/82 | HR 87 | Temp 98.4°F | Wt 257.4 lb

## 2022-11-11 DIAGNOSIS — E118 Type 2 diabetes mellitus with unspecified complications: Secondary | ICD-10-CM

## 2022-11-11 DIAGNOSIS — Z7985 Long-term (current) use of injectable non-insulin antidiabetic drugs: Secondary | ICD-10-CM | POA: Diagnosis not present

## 2022-11-11 LAB — MICROALBUMIN, URINE WAIVED
Creatinine, Urine Waived: 200 mg/dL (ref 10–300)
Microalb, Ur Waived: 30 mg/L — ABNORMAL HIGH (ref 0–19)
Microalb/Creat Ratio: <30 mg/g (ref <30)

## 2022-11-11 MED ORDER — SEMAGLUTIDE (1 MG/DOSE) 4 MG/3ML ~~LOC~~ SOPN: 1.0000 mg | PEN_INJECTOR | SUBCUTANEOUS | 1 refills | Status: DC

## 2022-11-11 NOTE — Assessment & Plan Note (Signed)
Chronic.  Well controlled at 6.1%.  Will increase dose of Ozempic to aid in weight loss benefits.  Microalbumin ordered today.  Requested eye exam.  Keep follow up for December.

## 2022-11-11 NOTE — Progress Notes (Signed)
Hi Mackenzie Kirby. Your urine did show some mild protein.  We will continue to monitor this over time.  This will likely improve as your A1c continues to go down.

## 2022-12-08 ENCOUNTER — Other Ambulatory Visit: Payer: Self-pay | Admitting: Nurse Practitioner

## 2022-12-08 NOTE — Telephone Encounter (Signed)
Requested medication (s) are due for refill today -yes  Requested medication (s) are on the active medication list -yes  Future visit scheduled -yes  Last refill: 07/24/22 30g 1RF  Notes to clinic: non delegated Rx  Requested Prescriptions  Pending Prescriptions Disp Refills   triamcinolone cream (KENALOG) 0.1 % [Pharmacy Med Name: TRIAMCINOLONE 0.1% CREAM] 30 g 1    Sig: APPLY TO AFFECTED AREA TWICE A DAY     Not Delegated - Dermatology:  Corticosteroids Failed - 12/08/2022  1:26 AM      Failed - This refill cannot be delegated      Passed - Valid encounter within last 12 months    Recent Outpatient Visits           3 weeks ago Controlled type 2 diabetes mellitus with complication, without long-term current use of insulin (HCC)   [facility] Larae Grooms, NP   3 months ago Controlled type 2 diabetes mellitus with complication, without long-term current use of insulin (HCC)   [place] [facility] Mecum, Erin E, PA-C   4 months ago Controlled type 2 diabetes mellitus with complication, without long-term current use of insulin (HCC)   [facility] Larae Grooms, NP   9 months ago Controlled type 2 diabetes mellitus with complication, without long-term current use of insulin (HCC)   [facility] Larae Grooms, NP   10 months ago Class 3 severe obesity due to excess calories without serious comorbidity with body mass index (BMI) of 40.0 to 44.9 in adult [facility])   [place] [facility] Larae Grooms, NP       Future Appointments             In 1 month Larae Grooms, NP [place] [facility]               Requested Prescriptions  Pending Prescriptions Disp Refills   triamcinolone cream (KENALOG) 0.1 % [Pharmacy Med Name: TRIAMCINOLONE 0.1% CREAM] 30 g 1    Sig: APPLY TO AFFECTED AREA TWICE A DAY     Not Delegated - Dermatology:   Corticosteroids Failed - 12/08/2022  1:26 AM      Failed - This refill cannot be delegated      Passed - Valid encounter within last 12 months    Recent Outpatient Visits           3 weeks ago Controlled type 2 diabetes mellitus with complication, without long-term current use of insulin (HCC)   [facility] Larae Grooms, NP   3 months ago Controlled type 2 diabetes mellitus with complication, without long-term current use of insulin (HCC)   [place] [facility] Mecum, Erin E, PA-C   4 months ago Controlled type 2 diabetes mellitus with complication, without long-term current use of insulin (HCC)   [facility] Larae Grooms, NP   9 months ago Controlled type 2 diabetes mellitus with complication, without long-term current use of insulin (HCC)   [place] [facility] Larae Grooms, NP   10 months ago Class 3 severe obesity due to excess calories without serious comorbidity with body mass index (BMI) of 40.0 to 44.9 in adult [facility])   [facility] Larae Grooms, NP       Future Appointments             In 1 month Larae Grooms, NP  [facility]

## 2023-01-25 ENCOUNTER — Encounter: Payer: BC Managed Care – PPO | Admitting: Nurse Practitioner

## 2023-02-19 ENCOUNTER — Encounter: Payer: Self-pay | Admitting: Nurse Practitioner

## 2023-02-19 ENCOUNTER — Ambulatory Visit (INDEPENDENT_AMBULATORY_CARE_PROVIDER_SITE_OTHER): Payer: BC Managed Care – PPO | Admitting: Nurse Practitioner

## 2023-02-19 VITALS — BP 138/92 | HR 100 | Ht 67.0 in | Wt 249.0 lb

## 2023-02-19 DIAGNOSIS — E66813 Obesity, class 3: Secondary | ICD-10-CM

## 2023-02-19 DIAGNOSIS — E119 Type 2 diabetes mellitus without complications: Secondary | ICD-10-CM | POA: Diagnosis not present

## 2023-02-19 DIAGNOSIS — E782 Mixed hyperlipidemia: Secondary | ICD-10-CM | POA: Insufficient documentation

## 2023-02-19 DIAGNOSIS — Z23 Encounter for immunization: Secondary | ICD-10-CM

## 2023-02-19 DIAGNOSIS — Z0000 Encounter for general adult medical examination without abnormal findings: Secondary | ICD-10-CM

## 2023-02-19 DIAGNOSIS — E118 Type 2 diabetes mellitus with unspecified complications: Secondary | ICD-10-CM | POA: Diagnosis not present

## 2023-02-19 DIAGNOSIS — Z6841 Body Mass Index (BMI) 40.0 and over, adult: Secondary | ICD-10-CM

## 2023-02-19 DIAGNOSIS — Z7985 Long-term (current) use of injectable non-insulin antidiabetic drugs: Secondary | ICD-10-CM | POA: Insufficient documentation

## 2023-02-19 LAB — URINALYSIS, ROUTINE W REFLEX MICROSCOPIC
Bilirubin, UA: NEGATIVE
Glucose, UA: NEGATIVE
Ketones, UA: NEGATIVE
Leukocytes,UA: NEGATIVE
Nitrite, UA: NEGATIVE
RBC, UA: NEGATIVE
Specific Gravity, UA: >1.03 — ABNORMAL HIGH (ref 1.005–1.030)
Urobilinogen, Ur: 0.2 mg/dL (ref 0.2–1.0)
pH, UA: 5.5 (ref 5.0–7.5)

## 2023-02-19 LAB — MICROSCOPIC EXAMINATION: RBC, Urine: NONE SEEN /[HPF] (ref 0–2)

## 2023-02-19 MED ORDER — SEMAGLUTIDE (1 MG/DOSE) 4 MG/3ML ~~LOC~~ SOPN: 1.0000 mg | PEN_INJECTOR | SUBCUTANEOUS | 1 refills | Status: DC

## 2023-02-19 MED ORDER — LISINOPRIL 2.5 MG PO TABS: 2.5000 mg | ORAL_TABLET | Freq: Every day | ORAL | 1 refills | Status: DC

## 2023-02-19 MED ORDER — ROSUVASTATIN CALCIUM 10 MG PO TABS: 10.0000 mg | ORAL_TABLET | Freq: Every day | ORAL | 1 refills | Status: DC

## 2023-02-19 NOTE — Assessment & Plan Note (Signed)
Chronic.  Controlled.  Last A1c 6.1%.  Continue with current medication regimen of Ozempic.  Patient has lost 8lbs since last visit.  Labs ordered today.  Return to clinic in 6 months for reevaluation.  Call sooner if concerns arise.

## 2023-02-19 NOTE — Progress Notes (Signed)
BP (!) 138/92   Pulse 100   Ht 5\' 7"  (1.702 m)   Wt 249 lb (112.9 kg)   BMI 39.00 kg/m    Subjective:    Patient ID: Mackenzie Kirby, female    DOB: Mar 06, 1990, 33 y.o.   MRN: [id number]  HPI: Mackenzie Kirby is a 33 y.o. female presenting on 02/19/2023 for comprehensive medical examination. Current medical complaints include:none  She currently lives with: Menopausal Symptoms: no  DIABETES Patient state she has been on the Ozempic 0.1mg .  Doing well without concerns.  Plans to get her eye exam soon.   Hypoglycemic episodes:no Polydipsia/polyuria: no Visual disturbance: no Chest pain: no Paresthesias: no Glucose Monitoring: no  Accucheck frequency: Not Checking  Fasting glucose:  Post prandial:  Evening:  Before meals: Taking Insulin?: no  Long acting insulin:  Short acting insulin: Blood Pressure Monitoring: not checking Retinal Examination: Up to Date Foot Exam: Up to Date Diabetic Education: Not Completed Pneumovax: Up to Date Influenza: Up to Date Aspirin: no  Depression Screen done today and results listed below:     02/19/2023    9:36 AM 11/11/2022   10:29 AM 09/04/2022    9:09 AM 07/24/2022   10:47 AM 02/25/2022   10:49 AM  Depression screen PHQ 2/9  Decreased Interest 0 0 0 0 0  Down, Depressed, Hopeless 0 0 0 1 1  PHQ - 2 Score 0 0 0 1 1  Altered sleeping 1 2 1 2 2   Tired, decreased energy 0 1 0 1 1  Change in appetite 0 0 0 0 0  Feeling bad or failure about yourself  0 0 1 1 2   Trouble concentrating 1 1 2 3 1   Moving slowly or fidgety/restless 1 1 2 1 3   Suicidal thoughts 0 0 0 0 0  PHQ-9 Score 3 5 6 9 10   Difficult doing work/chores Not difficult at all Not difficult at all Not difficult at all Somewhat difficult Somewhat difficult    The patient does not have a history of falls. I did complete a risk assessment for falls. A plan of care for falls was documented.   Past Medical History:  Past Medical History:  Diagnosis Date   Allergic  rhinitis    Eczema    Fibroid    Fibroids     Surgical History:  Past Surgical History:  Procedure Laterality Date   CARPAL TUNNEL RELEASE Right 05/02/2021   WISDOM TOOTH EXTRACTION      Medications:  Current Outpatient Medications on File Prior to Visit  Medication Sig   triamcinolone cream (KENALOG) 0.1 % APPLY TO AFFECTED AREA TWICE A DAY   No current facility-administered medications on file prior to visit.    Allergies:  No Known Allergies  Social History:  Social History   Socioeconomic History   Marital status: Legally Separated    Spouse name: Not on file   Number of children: Not on file   Years of education: Not on file   Highest education level: 12th grade  Occupational History   Not on file  Tobacco Use   Smoking status: Former    Current packs/day: 0.25    Types: Cigarettes   Smokeless tobacco: Never  Vaping Use   Vaping status: Every Day  Substance and Sexual Activity   Alcohol use: Yes    Comment: rare   Drug use: No   Sexual activity: Yes    Birth control/protection: None  Other Topics Concern   Not  on file  Social History Narrative   Not on file   Social Drivers of Health   Financial Resource Strain: Low Risk  (02/15/2023)   Overall Financial Resource Strain (CARDIA)    Difficulty of Paying Living Expenses: Not hard at all  Food Insecurity: No Food Insecurity (02/15/2023)   Hunger Vital Sign    Worried About Running Out of Food in the Last Year: Never true    Ran Out of Food in the Last Year: Never true  Transportation Needs: No Transportation Needs (02/15/2023)   PRAPARE - Administrator, Civil Service (Medical): No    Lack of Transportation (Non-Medical): No  Physical Activity: Sufficiently Active (02/15/2023)   Exercise Vital Sign    Days of Exercise per Week: 5 days    Minutes of Exercise per Session: 30 min  Stress: Stress Concern Present (02/15/2023)   [employer] of Occupational Health - Occupational Stress  Questionnaire    Feeling of Stress : Rather much  Social Connections: Moderately Isolated (02/15/2023)   Social Connection and Isolation Panel [NHANES]    Frequency of Communication with Friends and Family: More than three times a week    Frequency of Social Gatherings with Friends and Family: Twice a week    Attends Religious Services: Never    Database administrator or Organizations: No    Attends Engineer, structural: Not on file    Marital Status: Living with partner  Intimate Partner Violence: Not on file   Social History   Tobacco Use  Smoking Status Former   Current packs/day: 0.25   Types: Cigarettes  Smokeless Tobacco Never   Social History   Substance and Sexual Activity  Alcohol Use Yes   Comment: rare    Family History:  Family History  Problem Relation Age of Onset   Gestational diabetes Mother     Past medical history, surgical history, medications, allergies, family history and social history reviewed with patient today and changes made to appropriate areas of the chart.   Review of Systems  HENT:         Denies vision changes.  Eyes:  Negative for blurred vision and double vision.  Respiratory:  Negative for shortness of breath.   Cardiovascular:  Negative for chest pain, palpitations and leg swelling.  Neurological:  Negative for dizziness, tingling and headaches.  Endo/Heme/Allergies:  Negative for polydipsia.       Denies Polyuria   All other ROS negative except what is listed above and in the HPI.      Objective:    BP (!) 138/92   Pulse 100   Ht 5\' 7"  (1.702 m)   Wt 249 lb (112.9 kg)   BMI 39.00 kg/m   Wt Readings from Last 3 Encounters:  02/19/23 249 lb (112.9 kg)  11/11/22 257 lb 6.4 oz (116.8 kg)  09/04/22 258 lb 12.8 oz (117.4 kg)    Physical Exam Vitals and nursing note reviewed.  Constitutional:      General: She is awake. She is not in acute distress.    Appearance: Normal appearance. She is well-developed. She is  obese. She is not ill-appearing.  HENT:     Head: Normocephalic and atraumatic.     Right Ear: Hearing, tympanic membrane, ear canal and external ear normal. No drainage.     Left Ear: Hearing, tympanic membrane, ear canal and external ear normal. No drainage.     Nose: Nose normal.     Right  Sinus: No maxillary sinus tenderness or frontal sinus tenderness.     Left Sinus: No maxillary sinus tenderness or frontal sinus tenderness.     Mouth/Throat:     Mouth: Mucous membranes are moist.     Pharynx: Oropharynx is clear. Uvula midline. No pharyngeal swelling, oropharyngeal exudate or posterior oropharyngeal erythema.  Eyes:     General: Lids are normal.        Right eye: No discharge.        Left eye: No discharge.     Extraocular Movements: Extraocular movements intact.     Conjunctiva/sclera: Conjunctivae normal.     Pupils: Pupils are equal, round, and reactive to light.     Visual Fields: Right eye visual fields normal and left eye visual fields normal.  Neck:     Thyroid: No thyromegaly.     Vascular: No carotid bruit.     Trachea: Trachea normal.  Cardiovascular:     Rate and Rhythm: Normal rate and regular rhythm.     Heart sounds: Normal heart sounds. No murmur heard.    No gallop.  Pulmonary:     Effort: Pulmonary effort is normal. No accessory muscle usage or respiratory distress.     Breath sounds: Normal breath sounds.  Chest:  Breasts:    Right: Normal.     Left: Normal.  Abdominal:     General: Bowel sounds are normal.     Palpations: Abdomen is soft. There is no hepatomegaly or splenomegaly.     Tenderness: There is no abdominal tenderness.  Musculoskeletal:        General: Normal range of motion.     Cervical back: Normal range of motion and neck supple.     Right lower leg: No edema.     Left lower leg: No edema.  Lymphadenopathy:     Head:     Right side of head: No submental, submandibular, tonsillar, preauricular or posterior auricular adenopathy.      Left side of head: No submental, submandibular, tonsillar, preauricular or posterior auricular adenopathy.     Cervical: No cervical adenopathy.     Upper Body:     Right upper body: No supraclavicular, axillary or pectoral adenopathy.     Left upper body: No supraclavicular, axillary or pectoral adenopathy.  Skin:    General: Skin is warm and dry.     Capillary Refill: Capillary refill takes less than 2 seconds.     Findings: No rash.  Neurological:     Mental Status: She is alert and oriented to person, place, and time.     Gait: Gait is intact.  Psychiatric:        Attention and Perception: Attention normal.        Mood and Affect: Mood normal.        Speech: Speech normal.        Behavior: Behavior normal. Behavior is cooperative.        Thought Content: Thought content normal.        Judgment: Judgment normal.     Results for orders placed or performed in visit on 11/11/22  Microalbumin, Urine Waived   Collection Time: 11/11/22 10:45 AM  Result Value Ref Range   Microalb, Ur Waived 30 (H) 0 - 19 mg/L   Creatinine, Urine Waived 200 10 - 300 mg/dL   Microalb/Creat Ratio <30 <30 mg/g      Assessment & Plan:   Problem List Items Addressed This Visit  Endocrine   Diabetes type 2, controlled (HCC)   Chronic.  Controlled.  Last A1c 6.1%.  Continue with current medication regimen of Ozempic.  Patient has lost 8lbs since last visit.  Labs ordered today.  Return to clinic in 6 months for reevaluation.  Call sooner if concerns arise.        Relevant Medications   rosuvastatin (CRESTOR) 10 MG tablet   lisinopril (ZESTRIL) 2.5 MG tablet   Semaglutide, 1 MG/DOSE, 4 MG/3ML SOPN   Other Relevant Orders   HgB A1c   Diabetes mellitus treated with injections of non-insulin medication (HCC)   Relevant Medications   rosuvastatin (CRESTOR) 10 MG tablet   lisinopril (ZESTRIL) 2.5 MG tablet   Semaglutide, 1 MG/DOSE, 4 MG/3ML SOPN     Other   Obesity   Recommended eating  smaller high protein, low fat meals more frequently and exercising 30 mins a day 5 times a week with a goal of 10-15lb weight loss in the next 3 months.       Relevant Medications   Semaglutide, 1 MG/DOSE, 4 MG/3ML SOPN   Mixed hyperlipidemia   Chronic.  Rosuvastatin increased to 10mg .  Labs ordered at visit today.  Will make recommendations based on lab results.        Relevant Medications   rosuvastatin (CRESTOR) 10 MG tablet   lisinopril (ZESTRIL) 2.5 MG tablet   Other Relevant Orders   Lipid panel   Other Visit Diagnoses       Annual physical exam    -  Primary   Health maintenance reviewed during visit today.  Labs ordered.  Vaccines reviewed.  PAP up to date.   Relevant Orders   CBC with Differential/Platelet   Comprehensive metabolic panel   Lipid panel   TSH   Urinalysis, Routine w reflex microscopic   HgB A1c     Need for pneumococcal 20-valent conjugate vaccination       Relevant Orders   Pneumococcal conjugate vaccine 20-valent (Prevnar 20) (Completed)        Follow up plan: Return in about 6 months (around 08/19/2023) for HTN, HLD, DM2 FU.   LABORATORY TESTING:  - Pap smear: up to date  IMMUNIZATIONS:   - Tdap: Tetanus vaccination status reviewed: last tetanus booster within 10 years. - Influenza: Refused - Pneumovax: Not applicable - Prevnar: Up to date - COVID: Refused - HPV: Not applicable - Shingrix vaccine: Not applicable  SCREENING: -Mammogram: Not applicable  - Colonoscopy: Not applicable  - Bone Density: Not applicable  -Hearing Test: Not applicable  -Spirometry: Not applicable   PATIENT COUNSELING:   Advised to take 1 mg of folate supplement per day if capable of pregnancy.   Sexuality: Discussed sexually transmitted diseases, partner selection, use of condoms, avoidance of unintended pregnancy  and contraceptive alternatives.   Advised to avoid cigarette smoking.  I discussed with the patient that most people either abstain from  alcohol or drink within safe limits (<=14/week and <=4 drinks/occasion for males, <=7/weeks and <= 3 drinks/occasion for females) and that the risk for alcohol disorders and other health effects rises proportionally with the number of drinks per week and how often a drinker exceeds daily limits.  Discussed cessation/primary prevention of drug use and availability of treatment for abuse.   Diet: Encouraged to adjust caloric intake to maintain  or achieve ideal body weight, to reduce intake of dietary saturated fat and total fat, to limit sodium intake by avoiding high sodium foods and not adding  table salt, and to maintain adequate dietary potassium and calcium preferably from fresh fruits, vegetables, and low-fat dairy products.    stressed the importance of regular exercise  Injury prevention: Discussed safety belts, safety helmets, smoke detector, smoking near bedding or upholstery.   Dental health: Discussed importance of regular tooth brushing, flossing, and dental visits.    NEXT PREVENTATIVE PHYSICAL DUE IN 1 YEAR. Return in about 6 months (around 08/19/2023) for HTN, HLD, DM2 FU.

## 2023-02-19 NOTE — Assessment & Plan Note (Signed)
Recommended eating smaller high protein, low fat meals more frequently and exercising 30 mins a day 5 times a week with a goal of 10-15lb weight loss in the next 3 months.

## 2023-02-19 NOTE — Assessment & Plan Note (Signed)
Chronic.  Rosuvastatin increased to 10mg .  Labs ordered at visit today.  Will make recommendations based on lab results.

## 2023-02-20 LAB — COMPREHENSIVE METABOLIC PANEL
ALT: 24 [IU]/L (ref 0–32)
AST: 22 [IU]/L (ref 0–40)
Albumin: 4.3 g/dL (ref 3.9–4.9)
Alkaline Phosphatase: 63 [IU]/L (ref 44–121)
BUN/Creatinine Ratio: 11 (ref 9–23)
BUN: 9 mg/dL (ref 6–20)
Bilirubin Total: 0.3 mg/dL (ref 0.0–1.2)
CO2: 20 mmol/L (ref 20–29)
Calcium: 9 mg/dL (ref 8.7–10.2)
Chloride: 104 mmol/L (ref 96–106)
Creatinine, Ser: 0.84 mg/dL (ref 0.57–1.00)
Globulin, Total: 2.2 g/dL (ref 1.5–4.5)
Glucose: 90 mg/dL (ref 70–99)
Potassium: 4.3 mmol/L (ref 3.5–5.2)
Sodium: 138 mmol/L (ref 134–144)
Total Protein: 6.5 g/dL (ref 6.0–8.5)
eGFR: 95 mL/min/{1.73_m2} (ref >59)

## 2023-02-20 LAB — LIPID PANEL
Chol/HDL Ratio: 5 {ratio} — ABNORMAL HIGH (ref 0.0–4.4)
Cholesterol, Total: 165 mg/dL (ref 100–199)
HDL: 33 mg/dL — ABNORMAL LOW (ref >39)
LDL Chol Calc (NIH): 106 mg/dL — ABNORMAL HIGH (ref 0–99)
Triglycerides: 144 mg/dL (ref 0–149)
VLDL Cholesterol Cal: 26 mg/dL (ref 5–40)

## 2023-02-20 LAB — CBC WITH DIFFERENTIAL/PLATELET
Basophils Absolute: 0.1 10*3/uL (ref 0.0–0.2)
Basos: 1 %
EOS (ABSOLUTE): 0.1 10*3/uL (ref 0.0–0.4)
Eos: 2 %
Hematocrit: 42 % (ref 34.0–46.6)
Hemoglobin: 13.4 g/dL (ref 11.1–15.9)
Immature Grans (Abs): 0 10*3/uL (ref 0.0–0.1)
Immature Granulocytes: 0 %
Lymphocytes Absolute: 2.1 10*3/uL (ref 0.7–3.1)
Lymphs: 27 %
MCH: 26.8 pg (ref 26.6–33.0)
MCHC: 31.9 g/dL (ref 31.5–35.7)
MCV: 84 fL (ref 79–97)
Monocytes Absolute: 0.6 10*3/uL (ref 0.1–0.9)
Monocytes: 8 %
Neutrophils Absolute: 4.7 10*3/uL (ref 1.4–7.0)
Neutrophils: 62 %
Platelets: 409 10*3/uL (ref 150–450)
RBC: 5 x10E6/uL (ref 3.77–5.28)
RDW: 13.2 % (ref 11.7–15.4)
WBC: 7.6 10*3/uL (ref 3.4–10.8)

## 2023-02-20 LAB — TSH: TSH: 1.94 u[IU]/mL (ref 0.450–4.500)

## 2023-02-20 LAB — HEMOGLOBIN A1C
Est. average glucose Bld gHb Est-mCnc: 131 mg/dL
Hgb A1c MFr Bld: 6.2 % — ABNORMAL HIGH (ref 4.8–5.6)

## 2023-02-22 ENCOUNTER — Encounter: Payer: Self-pay | Admitting: Nurse Practitioner

## 2023-07-11 ENCOUNTER — Other Ambulatory Visit: Payer: Self-pay | Admitting: Nurse Practitioner

## 2023-07-13 NOTE — Telephone Encounter (Signed)
 Requested Prescriptions  Pending Prescriptions Disp Refills   Semaglutide , 1 MG/DOSE, (OZEMPIC , 1 MG/DOSE,) 4 MG/3ML SOPN [Pharmacy Med Name: OZEMPIC  4 MG/3 ML (1 MG/DOSE)] 6 mL 0    Sig: INJECT 1 MG ONCE A WEEK AS DIRECTED     Endocrinology:  Diabetes - GLP-1 Receptor Agonists - semaglutide  Failed - 07/13/2023  1:47 PM      Failed - HBA1C in normal range and within 180 days    HB A1C (BAYER DCA - WAIVED)  Date Value Ref Range Status  10/02/2022 6.1 (H) 4.8 - 5.6 % Final    Comment:             Prediabetes: 5.7 - 6.4          Diabetes: >6.4          Glycemic control for adults with diabetes: <7.0    Hgb A1c MFr Bld  Date Value Ref Range Status  02/19/2023 6.2 (H) 4.8 - 5.6 % Final    Comment:             Prediabetes: 5.7 - 6.4          Diabetes: >6.4          Glycemic control for adults with diabetes: <7.0          Failed - Valid encounter within last 6 months    Recent Outpatient Visits   None     Future Appointments             In 4 weeks Mackenzie Alexanders, NP [place] [facility]            Passed - Cr in normal range and within 360 days    Creatinine, Ser  Date Value Ref Range Status  02/19/2023 0.84 0.57 - 1.00 mg/dL Final

## 2023-08-11 ENCOUNTER — Ambulatory Visit: Payer: Self-pay | Admitting: Nurse Practitioner

## 2023-08-11 VITALS — BP 138/85 | HR 87 | Temp 98.2°F | Wt 245.6 lb

## 2023-08-11 DIAGNOSIS — Z1331 Encounter for screening for depression: Secondary | ICD-10-CM | POA: Insufficient documentation

## 2023-08-11 DIAGNOSIS — Z7985 Long-term (current) use of injectable non-insulin antidiabetic drugs: Secondary | ICD-10-CM

## 2023-08-11 DIAGNOSIS — K219 Gastro-esophageal reflux disease without esophagitis: Secondary | ICD-10-CM | POA: Diagnosis not present

## 2023-08-11 DIAGNOSIS — E119 Type 2 diabetes mellitus without complications: Secondary | ICD-10-CM | POA: Diagnosis not present

## 2023-08-11 DIAGNOSIS — E782 Mixed hyperlipidemia: Secondary | ICD-10-CM

## 2023-08-11 MED ORDER — LISINOPRIL 2.5 MG PO TABS: 2.5000 mg | ORAL_TABLET | Freq: Every day | ORAL | 1 refills | Status: DC

## 2023-08-11 MED ORDER — SEMAGLUTIDE (2 MG/DOSE) 8 MG/3ML ~~LOC~~ SOPN: 2.0000 mg | PEN_INJECTOR | SUBCUTANEOUS | 1 refills | Status: DC

## 2023-08-11 MED ORDER — OMEPRAZOLE 20 MG PO CPDR: 20.0000 mg | DELAYED_RELEASE_CAPSULE | Freq: Every day | ORAL | 1 refills | Status: DC

## 2023-08-11 MED ORDER — ROSUVASTATIN CALCIUM 10 MG PO TABS: 10.0000 mg | ORAL_TABLET | Freq: Every day | ORAL | 1 refills | Status: DC

## 2023-08-11 NOTE — Assessment & Plan Note (Signed)
 Chronic. A1c is controlled.  Has lost 4lbs since last visit.  Would like to increase her dose to aid in weight loss.  Eye exam done at my eye doctor.  Ozempic  increased to 2mg . Follow up in 6 months.  Call sooner if concerns arise.

## 2023-08-11 NOTE — Assessment & Plan Note (Signed)
 Will start Omeprazole  20mg  daily.  Side effects and benefits discussed.  Discussed how to properly take medication.  Follow up in 2 months.  Call sooner if concerns arise.

## 2023-08-11 NOTE — Assessment & Plan Note (Signed)
 Feels like her mood is related to her job changing hours.  Feels like it will improve with her new change coming up.  Will follow up in 2 months for reevaluation.

## 2023-08-11 NOTE — Progress Notes (Signed)
 BP 138/85   Pulse 87   Temp 98.2 F (36.8 C) (Oral)   Wt 245 lb 9.6 oz (111.4 kg)   LMP 08/06/2023   SpO2 98%   BMI 38.47 kg/m    Subjective:    Patient ID: Mackenzie Kirby, female    DOB: Sep 23, 1990, 33 y.o.   MRN: [id number]  HPI: Mackenzie Kirby is a 33 y.o. female  Chief Complaint  Patient presents with   Heartburn   Medication Management   DIABETES Patient state she has been on the Ozempic  1mg .  Doing well without concerns.  Plans to get her eye exam soon.  She is having heartburn.  She has been taking tums.   Hypoglycemic episodes:no Polydipsia/polyuria: no Visual disturbance: no Chest pain: no Paresthesias: no Glucose Monitoring: no  Accucheck frequency: Not Checking  Fasting glucose:  Post prandial:  Evening:  Before meals: Taking Insulin?: no  Long acting insulin:  Short acting insulin: Blood Pressure Monitoring: not checking Retinal Examination: Up to Date- My Eye [facility] Foot Exam: Up to Date Diabetic Education: Not Completed Pneumovax: Up to Date Influenza: Up to Date Aspirin: no  HYPERLIPIDEMIA Hyperlipidemia status: excellent compliance Satisfied with current treatment?  no Side effects:  yes Medication compliance: excellent compliance Past cholesterol meds: rosuvastatin  (crestor ) Supplements: none Aspirin:  no The ASCVD Risk score (Arnett DK, et al., 2019) failed to calculate for the following reasons:   The 2019 ASCVD risk score is only valid for ages 56 to 42 Chest pain:  no Coronary artery disease:  no Family history CAD:  no Family history early CAD:  no  Patient states she feels like her mood being worse is related to her new work hours.  She is about to change her schedule again and hope that her mood increases with the change in her schedule.  Relevant past medical, surgical, family and social history reviewed and updated as indicated. Interim medical history since our last visit reviewed. Allergies and medications reviewed and  updated.  Review of Systems  Eyes:  Negative for visual disturbance.  Respiratory:  Negative for cough, chest tightness and shortness of breath.   Cardiovascular:  Negative for chest pain, palpitations and leg swelling.  Gastrointestinal:        GERD  Endocrine: Negative for polydipsia and polyuria.  Neurological:  Negative for dizziness, numbness and headaches.    Per HPI unless specifically indicated above     Objective:    BP 138/85   Pulse 87   Temp 98.2 F (36.8 C) (Oral)   Wt 245 lb 9.6 oz (111.4 kg)   LMP 08/06/2023   SpO2 98%   BMI 38.47 kg/m   Wt Readings from Last 3 Encounters:  08/11/23 245 lb 9.6 oz (111.4 kg)  02/19/23 249 lb (112.9 kg)  11/11/22 257 lb 6.4 oz (116.8 kg)    Physical Exam Vitals and nursing note reviewed.  Constitutional:      General: She is not in acute distress.    Appearance: Normal appearance. She is obese. She is not ill-appearing, toxic-appearing or diaphoretic.  HENT:     Head: Normocephalic.     Right Ear: External ear normal.     Left Ear: External ear normal.     Nose: Nose normal.     Mouth/Throat:     Mouth: Mucous membranes are moist.     Pharynx: Oropharynx is clear.  Eyes:     General:        Right eye: No discharge.  Left eye: No discharge.     Extraocular Movements: Extraocular movements intact.     Conjunctiva/sclera: Conjunctivae normal.     Pupils: Pupils are equal, round, and reactive to light.  Cardiovascular:     Rate and Rhythm: Normal rate and regular rhythm.     Heart sounds: No murmur heard. Pulmonary:     Effort: Pulmonary effort is normal. No respiratory distress.     Breath sounds: Normal breath sounds. No wheezing or rales.  Musculoskeletal:     Cervical back: Normal range of motion and neck supple.  Skin:    General: Skin is warm and dry.     Capillary Refill: Capillary refill takes less than 2 seconds.  Neurological:     General: No focal deficit present.     Mental Status: She is  alert and oriented to person, place, and time. Mental status is at baseline.  Psychiatric:        Mood and Affect: Mood normal.        Behavior: Behavior normal.        Thought Content: Thought content normal.        Judgment: Judgment normal.     Results for orders placed or performed in visit on 02/19/23  Microscopic Examination   Collection Time: 02/19/23 10:05 AM   Urine  Result Value Ref Range   WBC, UA 0-5 0 - 5 /hpf   RBC, Urine None seen 0 - 2 /hpf   Epithelial Cells (non renal) 0-10 0 - 10 /hpf   Casts Present (A) None seen /lpf   Cast Type Hyaline casts N/A   Mucus, UA Present (A) Not Estab.   Bacteria, UA Moderate (A) None seen/Few  CBC with Differential/Platelet   Collection Time: 02/19/23 10:05 AM  Result Value Ref Range   WBC 7.6 3.4 - 10.8 x10E3/uL   RBC 5.00 3.77 - 5.28 x10E6/uL   Hemoglobin 13.4 11.1 - 15.9 g/dL   Hematocrit 57.9 65.9 - 46.6 %   MCV 84 79 - 97 fL   MCH 26.8 26.6 - 33.0 pg   MCHC 31.9 31.5 - 35.7 g/dL   RDW 86.7 88.2 - 84.5 %   Platelets 409 150 - 450 x10E3/uL   Neutrophils 62 Not Estab. %   Lymphs 27 Not Estab. %   Monocytes 8 Not Estab. %   Eos 2 Not Estab. %   Basos 1 Not Estab. %   Neutrophils Absolute 4.7 1.4 - 7.0 x10E3/uL   Lymphocytes Absolute 2.1 0.7 - 3.1 x10E3/uL   Monocytes Absolute 0.6 0.1 - 0.9 x10E3/uL   EOS (ABSOLUTE) 0.1 0.0 - 0.4 x10E3/uL   Basophils Absolute 0.1 0.0 - 0.2 x10E3/uL   Immature Granulocytes 0 Not Estab. %   Immature Grans (Abs) 0.0 0.0 - 0.1 x10E3/uL  Comprehensive metabolic panel   Collection Time: 02/19/23 10:05 AM  Result Value Ref Range   Glucose 90 70 - 99 mg/dL   BUN 9 6 - 20 mg/dL   Creatinine, Ser 9.15 0.57 - 1.00 mg/dL   eGFR 95 >40 fO/fpw/8.26   BUN/Creatinine Ratio 11 9 - 23   Sodium 138 134 - 144 mmol/L   Potassium 4.3 3.5 - 5.2 mmol/L   Chloride 104 96 - 106 mmol/L   CO2 20 20 - 29 mmol/L   Calcium  9.0 8.7 - 10.2 mg/dL   Total Protein 6.5 6.0 - 8.5 g/dL   Albumin 4.3 3.9 - 4.9  g/dL   Globulin, Total 2.2 1.5 - 4.5  g/dL   Bilirubin Total 0.3 0.0 - 1.2 mg/dL   Alkaline Phosphatase 63 44 - 121 IU/L   AST 22 0 - 40 IU/L   ALT 24 0 - 32 IU/L  Lipid panel   Collection Time: 02/19/23 10:05 AM  Result Value Ref Range   Cholesterol, Total 165 100 - 199 mg/dL   Triglycerides 855 0 - 149 mg/dL   HDL 33 (L) >60 mg/dL   VLDL Cholesterol Cal 26 5 - 40 mg/dL   LDL Chol Calc (NIH) 893 (H) 0 - 99 mg/dL   Chol/HDL Ratio 5.0 (H) 0.0 - 4.4 ratio  TSH   Collection Time: 02/19/23 10:05 AM  Result Value Ref Range   TSH 1.940 0.450 - 4.500 uIU/mL  Urinalysis, Routine w reflex microscopic   Collection Time: 02/19/23 10:05 AM  Result Value Ref Range   Specific Gravity, UA >1.030 (H) 1.005 - 1.030   pH, UA 5.5 5.0 - 7.5   Color, UA Yellow Yellow   Appearance Ur Clear Clear   Leukocytes,UA Negative Negative   Protein,UA 1+ (A) Negative/Trace   Glucose, UA Negative Negative   Ketones, UA Negative Negative   RBC, UA Negative Negative   Bilirubin, UA Negative Negative   Urobilinogen, Ur 0.2 0.2 - 1.0 mg/dL   Nitrite, UA Negative Negative   Microscopic Examination See below:   HgB A1c   Collection Time: 02/19/23 10:05 AM  Result Value Ref Range   Hgb A1c MFr Bld 6.2 (H) 4.8 - 5.6 %   Est. average glucose Bld gHb Est-mCnc 131 mg/dL      Assessment & Plan:   Problem List Items Addressed This Visit       Digestive   GERD (gastroesophageal reflux disease)   Will start Omeprazole  20mg  daily.  Side effects and benefits discussed.  Discussed how to properly take medication.  Follow up in 2 months.  Call sooner if concerns arise.       Relevant Medications   omeprazole  (PRILOSEC) 20 MG capsule     Endocrine   Diabetes mellitus treated with injections of non-insulin medication (HCC) - Primary   Chronic. A1c is controlled.  Has lost 4lbs since last visit.  Would like to increase her dose to aid in weight loss.  Eye exam done at my eye doctor.  Ozempic  increased to 2mg .  Follow up in 6 months.  Call sooner if concerns arise.       Relevant Medications   lisinopril  (ZESTRIL ) 2.5 MG tablet   rosuvastatin  (CRESTOR ) 10 MG tablet   Semaglutide , 2 MG/DOSE, 8 MG/3ML SOPN   Other Relevant Orders   Comprehensive metabolic panel with GFR   Hemoglobin A1c     Other   Mixed hyperlipidemia   Relevant Medications   lisinopril  (ZESTRIL ) 2.5 MG tablet   rosuvastatin  (CRESTOR ) 10 MG tablet   Other Relevant Orders   Lipid panel   Positive depression screening   Feels like her mood is related to her job changing hours.  Feels like it will improve with her new change coming up.  Will follow up in 2 months for reevaluation.         Follow up plan: Return in about 2 months (around 10/12/2023) for Mood and GERD FU.

## 2023-08-12 ENCOUNTER — Ambulatory Visit: Payer: Self-pay | Admitting: Nurse Practitioner

## 2023-08-12 LAB — COMPREHENSIVE METABOLIC PANEL WITH GFR
ALT: 25 IU/L (ref 0–32)
AST: 20 IU/L (ref 0–40)
Albumin: 4.3 g/dL (ref 3.9–4.9)
Alkaline Phosphatase: 62 IU/L (ref 44–121)
BUN/Creatinine Ratio: 11 (ref 9–23)
BUN: 9 mg/dL (ref 6–20)
Bilirubin Total: 0.4 mg/dL (ref 0.0–1.2)
CO2: 17 mmol/L — ABNORMAL LOW (ref 20–29)
Calcium: 9.2 mg/dL (ref 8.7–10.2)
Chloride: 105 mmol/L (ref 96–106)
Creatinine, Ser: 0.81 mg/dL (ref 0.57–1.00)
Globulin, Total: 2.2 g/dL (ref 1.5–4.5)
Glucose: 84 mg/dL (ref 70–99)
Potassium: 4.1 mmol/L (ref 3.5–5.2)
Sodium: 139 mmol/L (ref 134–144)
Total Protein: 6.5 g/dL (ref 6.0–8.5)
eGFR: 99 mL/min/1.73 (ref >59)

## 2023-08-12 LAB — HEMOGLOBIN A1C
Est. average glucose Bld gHb Est-mCnc: 126 mg/dL
Hgb A1c MFr Bld: 6 % — ABNORMAL HIGH (ref 4.8–5.6)

## 2023-08-12 LAB — LIPID PANEL
Chol/HDL Ratio: 4.4 ratio (ref 0.0–4.4)
Cholesterol, Total: 157 mg/dL (ref 100–199)
HDL: 36 mg/dL — ABNORMAL LOW (ref >39)
LDL Chol Calc (NIH): 99 mg/dL (ref 0–99)
Triglycerides: 123 mg/dL (ref 0–149)
VLDL Cholesterol Cal: 22 mg/dL (ref 5–40)

## 2023-08-26 ENCOUNTER — Other Ambulatory Visit (HOSPITAL_COMMUNITY): Payer: Self-pay

## 2023-08-26 ENCOUNTER — Telehealth: Payer: Self-pay

## 2023-08-26 NOTE — Telephone Encounter (Signed)
 Pharmacy Patient Advocate Encounter  Received notification from [facility] [employer] that Prior Authorization for Ozempic  (2 MG/DOSE) 8MG /3ML pen-injectors  has been APPROVED from 08/26/23 to 08/25/24. Ran test claim, Copay is $24.99. This test claim was processed through [facility]- copay amounts may vary at other pharmacies due to pharmacy/plan contracts, or as the patient moves through the different stages of their insurance plan.   PA #/Case ID/Reference #: [id number]

## 2023-09-10 ENCOUNTER — Encounter: Payer: Self-pay | Admitting: Nurse Practitioner

## 2023-10-13 ENCOUNTER — Encounter: Payer: Self-pay | Admitting: Nurse Practitioner

## 2023-10-13 ENCOUNTER — Ambulatory Visit: Admitting: Nurse Practitioner

## 2023-10-13 VITALS — BP 136/87 | HR 87 | Temp 99.1°F | Ht 67.0 in | Wt 243.6 lb

## 2023-10-13 DIAGNOSIS — Z1331 Encounter for screening for depression: Secondary | ICD-10-CM | POA: Diagnosis not present

## 2023-10-13 DIAGNOSIS — K219 Gastro-esophageal reflux disease without esophagitis: Secondary | ICD-10-CM | POA: Diagnosis not present

## 2023-10-13 MED ORDER — TRIAMCINOLONE ACETONIDE 0.1 % EX CREA: TOPICAL_CREAM | Freq: Two times a day (BID) | CUTANEOUS | 1 refills | Status: AC

## 2023-10-13 NOTE — Assessment & Plan Note (Signed)
 Chronic.  Controlled.  Continue with current medication regimen of Omeprazole . Return to clinic in 6 months for reevaluation.  Call sooner if concerns arise.

## 2023-10-13 NOTE — Assessment & Plan Note (Signed)
 Ongoing problem.  Does not want to start medication.  Does not have any SI. Discussed that she can return to clinic any time to discuss medication options.  Follow up in 4 months.  Call sooner if concerns arise.

## 2023-10-13 NOTE — Progress Notes (Signed)
 BP 136/87   Pulse 87   Temp 99.1 F (37.3 C) (Oral)   Ht 5' 7 (1.702 m)   Wt 243 lb 9.6 oz (110.5 kg)   LMP 10/08/2023 (Exact Date)   SpO2 98%   BMI 38.15 kg/m    Subjective:    Patient ID: Mackenzie Kirby, female    DOB: [birth year], 33 y.o.   MRN: [id number]  HPI: Mackenzie Kirby is a 33 y.o. female  Chief Complaint  Patient presents with   Depression   Gastroesophageal Reflux   MOOD Patient states she feels like her mood being worse is related to her new work hours.  She had a melt down a couple of weeks ago.  She doesn't feel like things have improved.  She doesn't want to start any medication.  Feels like things are hard to manage at times.  She has been sleeping a lot.  Denies SI.  Flowsheet Row Office Visit from 10/13/2023 in [facility]  PHQ-9 Total Score 13      10/13/2023    8:17 AM 08/11/2023    9:32 AM 02/19/2023    9:36 AM 11/11/2022   10:29 AM  GAD 7 : Generalized Anxiety Score  Nervous, Anxious, on Edge 2 2 1 1   Control/stop worrying 1 1 0 0  Worry too much - different things 3 2 0 0  Trouble relaxing 3 3 1 1   Restless 3 2 1 1   Easily annoyed or irritable 2 1 0 0  Afraid - awful might happen 0 0 0 0  Total GAD 7 Score 14 11 3 3   Anxiety Difficulty Somewhat difficult Somewhat difficult Not difficult at all Not difficult at all    GERD Patient states her acid reflux is a lot better with omeprazole .  Feels like it is working well for her.   Relevant past medical, surgical, family and social history reviewed and updated as indicated. Interim medical history since our last visit reviewed. Allergies and medications reviewed and updated.  Review of Systems  Gastrointestinal:  Positive for abdominal pain.  Psychiatric/Behavioral:  Positive for dysphoric mood. Negative for suicidal ideas. The patient is nervous/anxious.     Per HPI unless specifically indicated above     Objective:    BP 136/87   Pulse 87   Temp 99.1 F  (37.3 C) (Oral)   Ht 5' 7 (1.702 m)   Wt 243 lb 9.6 oz (110.5 kg)   LMP 10/08/2023 (Exact Date)   SpO2 98%   BMI 38.15 kg/m   Wt Readings from Last 3 Encounters:  10/13/23 243 lb 9.6 oz (110.5 kg)  08/11/23 245 lb 9.6 oz (111.4 kg)  02/19/23 249 lb (112.9 kg)    Physical Exam Vitals and nursing note reviewed.  Constitutional:      General: She is not in acute distress.    Appearance: Normal appearance. She is obese. She is not ill-appearing, toxic-appearing or diaphoretic.  HENT:     Head: Normocephalic.     Right Ear: External ear normal.     Left Ear: External ear normal.     Nose: Nose normal.     Mouth/Throat:     Mouth: Mucous membranes are moist.     Pharynx: Oropharynx is clear.  Eyes:     General:        Right eye: No discharge.        Left eye: No discharge.     Extraocular Movements: Extraocular movements intact.  Conjunctiva/sclera: Conjunctivae normal.     Pupils: Pupils are equal, round, and reactive to light.  Cardiovascular:     Rate and Rhythm: Normal rate and regular rhythm.     Heart sounds: No murmur heard. Pulmonary:     Effort: Pulmonary effort is normal. No respiratory distress.     Breath sounds: Normal breath sounds. No wheezing or rales.  Musculoskeletal:     Cervical back: Normal range of motion and neck supple.  Skin:    General: Skin is warm and dry.     Capillary Refill: Capillary refill takes less than 2 seconds.  Neurological:     General: No focal deficit present.     Mental Status: She is alert and oriented to person, place, and time. Mental status is at baseline.  Psychiatric:        Mood and Affect: Mood normal.        Behavior: Behavior normal.        Thought Content: Thought content normal.        Judgment: Judgment normal.     Results for orders placed or performed in visit on 08/11/23  Comprehensive metabolic panel with GFR   Collection Time: 08/11/23  9:44 AM  Result Value Ref Range   Glucose 84 70 - 99 mg/dL    BUN 9 6 - 20 mg/dL   Creatinine, Ser 9.18 0.57 - 1.00 mg/dL   eGFR 99 >40 fO/fpw/8.26   BUN/Creatinine Ratio 11 9 - 23   Sodium 139 134 - 144 mmol/L   Potassium 4.1 3.5 - 5.2 mmol/L   Chloride 105 96 - 106 mmol/L   CO2 17 (L) 20 - 29 mmol/L   Calcium  9.2 8.7 - 10.2 mg/dL   Total Protein 6.5 6.0 - 8.5 g/dL   Albumin 4.3 3.9 - 4.9 g/dL   Globulin, Total 2.2 1.5 - 4.5 g/dL   Bilirubin Total 0.4 0.0 - 1.2 mg/dL   Alkaline Phosphatase 62 44 - 121 IU/L   AST 20 0 - 40 IU/L   ALT 25 0 - 32 IU/L  Hemoglobin A1c   Collection Time: 08/11/23  9:44 AM  Result Value Ref Range   Hgb A1c MFr Bld 6.0 (H) 4.8 - 5.6 %   Est. average glucose Bld gHb Est-mCnc 126 mg/dL  Lipid panel   Collection Time: 08/11/23  9:44 AM  Result Value Ref Range   Cholesterol, Total 157 100 - 199 mg/dL   Triglycerides 876 0 - 149 mg/dL   HDL 36 (L) >60 mg/dL   VLDL Cholesterol Cal 22 5 - 40 mg/dL   LDL Chol Calc (NIH) 99 0 - 99 mg/dL   Chol/HDL Ratio 4.4 0.0 - 4.4 ratio      Assessment & Plan:   Problem List Items Addressed This Visit       Digestive   GERD (gastroesophageal reflux disease)   Chronic.  Controlled.  Continue with current medication regimen of Omeprazole . Return to clinic in 6 months for reevaluation.  Call sooner if concerns arise.          Other   Positive depression screening - Primary   Ongoing problem.  Does not want to start medication.  Does not have any SI. Discussed that she can return to clinic any time to discuss medication options.  Follow up in 4 months.  Call sooner if concerns arise.         Follow up plan: Return in about 4 months (around 02/12/2024) for HTN, HLD,  DM2 FU.

## 2023-10-14 ENCOUNTER — Ambulatory Visit: Admitting: Nurse Practitioner

## 2024-02-15 ENCOUNTER — Encounter: Payer: Self-pay | Admitting: Nurse Practitioner

## 2024-02-15 ENCOUNTER — Ambulatory Visit (INDEPENDENT_AMBULATORY_CARE_PROVIDER_SITE_OTHER): Admitting: Nurse Practitioner

## 2024-02-15 VITALS — BP 128/89 | HR 92 | Temp 98.2°F | Ht 67.01 in | Wt 243.0 lb

## 2024-02-15 DIAGNOSIS — Z1331 Encounter for screening for depression: Secondary | ICD-10-CM | POA: Diagnosis not present

## 2024-02-15 DIAGNOSIS — F321 Major depressive disorder, single episode, moderate: Secondary | ICD-10-CM | POA: Diagnosis not present

## 2024-02-15 DIAGNOSIS — Z7985 Long-term (current) use of injectable non-insulin antidiabetic drugs: Secondary | ICD-10-CM | POA: Diagnosis not present

## 2024-02-15 DIAGNOSIS — E782 Mixed hyperlipidemia: Secondary | ICD-10-CM

## 2024-02-15 DIAGNOSIS — E119 Type 2 diabetes mellitus without complications: Secondary | ICD-10-CM

## 2024-02-15 MED ORDER — LISINOPRIL 2.5 MG PO TABS: 2.5000 mg | ORAL_TABLET | Freq: Every day | ORAL | 1 refills | Status: AC

## 2024-02-15 MED ORDER — SEMAGLUTIDE (2 MG/DOSE) 8 MG/3ML ~~LOC~~ SOPN: 2.0000 mg | PEN_INJECTOR | SUBCUTANEOUS | 1 refills | Status: AC

## 2024-02-15 MED ORDER — ROSUVASTATIN CALCIUM 10 MG PO TABS: 10.0000 mg | ORAL_TABLET | Freq: Every day | ORAL | 1 refills | Status: AC

## 2024-02-15 MED ORDER — OMEPRAZOLE 20 MG PO CPDR: 20.0000 mg | DELAYED_RELEASE_CAPSULE | Freq: Every day | ORAL | 1 refills | Status: AC

## 2024-02-15 MED ORDER — CITALOPRAM HYDROBROMIDE 10 MG PO TABS: 10.0000 mg | ORAL_TABLET | Freq: Every day | ORAL | 0 refills | Status: AC

## 2024-02-15 NOTE — Assessment & Plan Note (Signed)
 Chronic.  Will start Citalopram  10mg  daily.  Side effects and benefits of medications discussed. Would like to pursue PTSD diagnosis.  Referral placed for evaluation with Psychiatry.  Follow up in 1 month.  Call sooner if concerns arise.

## 2024-02-15 NOTE — Assessment & Plan Note (Signed)
 Chronic. A1c is controlled at 6.0%. Continue with current dose of Ozempic  2mg .  Continue with weight loss.   Eye exam done at my eye doctor.  Follow up in 6 months.  Call sooner if concerns arise.

## 2024-02-15 NOTE — Progress Notes (Addendum)
 "  BP 128/89   Pulse 92   Temp 98.2 F (36.8 C) (Oral)   Ht 5' 7.01 (1.702 m)   Wt 243 lb (110.2 kg)   LMP 02/09/2024 (Exact Date)   SpO2 100%   BMI 38.05 kg/m    Subjective:    Patient ID: Mackenzie Kirby, female    DOB: 1990/12/17, 34 y.o.   MRN: [id number]  HPI: Mackenzie Kirby is a 34 y.o. female  Chief Complaint  Patient presents with   office visit    4 month F/u   Anxiety    Patient stated people around her has noticed her anxiety is getting worse.   MOOD Patient states she feels like she is more on edge.  People around her have noticed that she has been more anxious.  She would like to be screening for PTSD.  She only sleeps about 4 hours.  Denies SI.   Flowsheet Row Office Visit from 02/15/2024 in [facility]  PHQ-9 Total Score 17      02/15/2024   10:20 AM 10/13/2023    8:17 AM 08/11/2023    9:32 AM 02/19/2023    9:36 AM  GAD 7 : Generalized Anxiety Score  Nervous, Anxious, on Edge 3 2  2  1    Control/stop worrying 2 1  1   0   Worry too much - different things 2 3  2   0   Trouble relaxing 3 3  3  1    Restless 3 3  2  1    Easily annoyed or irritable 3 2  1   0   Afraid - awful might happen 1 0  0  0   Total GAD 7 Score 17 14 11 3   Anxiety Difficulty Somewhat difficult Somewhat difficult Somewhat difficult Not difficult at all     Data saved with a previous flowsheet row definition     DIABETES Patient state she has been on the Ozempic  2mg .  Doing well without concerns. Hypoglycemic episodes:no Polydipsia/polyuria: no Visual disturbance: no Chest pain: no Paresthesias: no Glucose Monitoring: no  Accucheck frequency: Not Checking  Fasting glucose:  Post prandial:  Evening:  Before meals: Taking Insulin?: no  Long acting insulin:  Short acting insulin: Blood Pressure Monitoring: not checking Retinal Examination: Up to Date- [facility] Foot Exam: Up to Date Diabetic Education: Not Completed Pneumovax: Up to  Date Influenza: Up to Date Aspirin: no  HYPERLIPIDEMIA Hyperlipidemia status: excellent compliance Satisfied with current treatment?  no Side effects:  yes Medication compliance: excellent compliance Past cholesterol meds: rosuvastatin  (crestor ) Supplements: none Aspirin:  no The ASCVD Risk score (Arnett DK, et al., 2019) failed to calculate for the following reasons:   The 2019 ASCVD risk score is only valid for ages 8 to 78 Chest pain:  no Coronary artery disease:  no Family history CAD:  no Family history early CAD:  no  Relevant past medical, surgical, family and social history reviewed and updated as indicated. Interim medical history since our last visit reviewed. Allergies and medications reviewed and updated.  Review of Systems  Eyes:  Negative for visual disturbance.  Cardiovascular:  Negative for chest pain.  Endocrine: Negative for polydipsia and polyuria.  Neurological:  Negative for numbness.  Psychiatric/Behavioral:  Positive for decreased concentration. Negative for suicidal ideas. The patient is nervous/anxious.     Per HPI unless specifically indicated above     Objective:    BP 128/89   Pulse 92   Temp  98.2 F (36.8 C) (Oral)   Ht 5' 7.01 (1.702 m)   Wt 243 lb (110.2 kg)   LMP 02/09/2024 (Exact Date)   SpO2 100%   BMI 38.05 kg/m   Wt Readings from Last 3 Encounters:  02/15/24 243 lb (110.2 kg)  10/13/23 243 lb 9.6 oz (110.5 kg)  08/11/23 245 lb 9.6 oz (111.4 kg)    Physical Exam Vitals and nursing note reviewed.  Constitutional:      General: She is not in acute distress.    Appearance: Normal appearance. She is obese. She is not ill-appearing, toxic-appearing or diaphoretic.  HENT:     Head: Normocephalic.     Right Ear: External ear normal.     Left Ear: External ear normal.     Nose: Nose normal.     Mouth/Throat:     Mouth: Mucous membranes are moist.     Pharynx: Oropharynx is clear.  Eyes:     General:        Right eye: No  discharge.        Left eye: No discharge.     Extraocular Movements: Extraocular movements intact.     Conjunctiva/sclera: Conjunctivae normal.     Pupils: Pupils are equal, round, and reactive to light.  Cardiovascular:     Rate and Rhythm: Normal rate and regular rhythm.     Heart sounds: No murmur heard. Pulmonary:     Effort: Pulmonary effort is normal. No respiratory distress.     Breath sounds: Normal breath sounds. No wheezing or rales.  Musculoskeletal:     Cervical back: Normal range of motion and neck supple.  Skin:    General: Skin is warm and dry.     Capillary Refill: Capillary refill takes less than 2 seconds.  Neurological:     General: No focal deficit present.     Mental Status: She is alert and oriented to person, place, and time. Mental status is at baseline.  Psychiatric:        Mood and Affect: Mood normal.        Behavior: Behavior normal.        Thought Content: Thought content normal.        Judgment: Judgment normal.     Results for orders placed or performed in visit on 08/11/23  Comprehensive metabolic panel with GFR   Collection Time: 08/11/23  9:44 AM  Result Value Ref Range   Glucose 84 70 - 99 mg/dL   BUN 9 6 - 20 mg/dL   Creatinine, Ser 9.18 0.57 - 1.00 mg/dL   eGFR 99 >40 fO/fpw/8.26   BUN/Creatinine Ratio 11 9 - 23   Sodium 139 134 - 144 mmol/L   Potassium 4.1 3.5 - 5.2 mmol/L   Chloride 105 96 - 106 mmol/L   CO2 17 (L) 20 - 29 mmol/L   Calcium  9.2 8.7 - 10.2 mg/dL   Total Protein 6.5 6.0 - 8.5 g/dL   Albumin 4.3 3.9 - 4.9 g/dL   Globulin, Total 2.2 1.5 - 4.5 g/dL   Bilirubin Total 0.4 0.0 - 1.2 mg/dL   Alkaline Phosphatase 62 44 - 121 IU/L   AST 20 0 - 40 IU/L   ALT 25 0 - 32 IU/L  Hemoglobin A1c   Collection Time: 08/11/23  9:44 AM  Result Value Ref Range   Hgb A1c MFr Bld 6.0 (H) 4.8 - 5.6 %   Est. average glucose Bld gHb Est-mCnc 126 mg/dL  Lipid panel  Collection Time: 08/11/23  9:44 AM  Result Value Ref Range    Cholesterol, Total 157 100 - 199 mg/dL   Triglycerides 876 0 - 149 mg/dL   HDL 36 (L) >60 mg/dL   VLDL Cholesterol Cal 22 5 - 40 mg/dL   LDL Chol Calc (NIH) 99 0 - 99 mg/dL   Chol/HDL Ratio 4.4 0.0 - 4.4 ratio      Assessment & Plan:   Problem List Items Addressed This Visit       Endocrine   Diabetes mellitus treated with injections of non-insulin medication (HCC) - Primary   Chronic. A1c is controlled at 6.0%. Continue with current dose of Ozempic  2mg .  Continue with weight loss.   Eye exam done at my eye doctor.  Follow up in 6 months.  Call sooner if concerns arise.       Relevant Medications   lisinopril  (ZESTRIL ) 2.5 MG tablet   rosuvastatin  (CRESTOR ) 10 MG tablet   Semaglutide , 2 MG/DOSE, 8 MG/3ML SOPN   Other Relevant Orders   Comprehensive metabolic panel with GFR   Hemoglobin A1c     Other   Mixed hyperlipidemia   Relevant Medications   lisinopril  (ZESTRIL ) 2.5 MG tablet   rosuvastatin  (CRESTOR ) 10 MG tablet   Other Relevant Orders   Lipid panel   Positive depression screening   Relevant Orders   Ambulatory referral to Psychiatry   Depression, major, single episode, moderate (HCC)   Chronic.  Will start Citalopram  10mg  daily.  Side effects and benefits of medications discussed. Would like to pursue PTSD diagnosis.  Referral placed for evaluation with Psychiatry.  Follow up in 1 month.  Call sooner if concerns arise.       Relevant Medications   citalopram  (CELEXA ) 10 MG tablet     Follow up plan: Return in about 1 month (around 03/17/2024) for Depression/Anxiety FU.      "

## 2024-02-16 ENCOUNTER — Ambulatory Visit: Payer: Self-pay | Admitting: Nurse Practitioner

## 2024-02-16 LAB — COMPREHENSIVE METABOLIC PANEL WITH GFR
ALT: 25 IU/L (ref 0–32)
AST: 19 IU/L (ref 0–40)
Albumin: 4.3 g/dL (ref 3.9–4.9)
Alkaline Phosphatase: 57 IU/L (ref 41–116)
BUN/Creatinine Ratio: 9 (ref 9–23)
BUN: 8 mg/dL (ref 6–20)
Bilirubin Total: <0.2 mg/dL (ref 0.0–1.2)
CO2: 21 mmol/L (ref 20–29)
Calcium: 9 mg/dL (ref 8.7–10.2)
Chloride: 105 mmol/L (ref 96–106)
Creatinine, Ser: 0.85 mg/dL (ref 0.57–1.00)
Globulin, Total: 2 g/dL (ref 1.5–4.5)
Glucose: 86 mg/dL (ref 70–99)
Potassium: 4.1 mmol/L (ref 3.5–5.2)
Sodium: 142 mmol/L (ref 134–144)
Total Protein: 6.3 g/dL (ref 6.0–8.5)
eGFR: 93 mL/min/1.73

## 2024-02-16 LAB — LIPID PANEL
Chol/HDL Ratio: 4.1 ratio (ref 0.0–4.4)
Cholesterol, Total: 145 mg/dL (ref 100–199)
HDL: 35 mg/dL — ABNORMAL LOW
LDL Chol Calc (NIH): 90 mg/dL (ref 0–99)
Triglycerides: 106 mg/dL (ref 0–149)
VLDL Cholesterol Cal: 20 mg/dL (ref 5–40)

## 2024-02-16 LAB — HEMOGLOBIN A1C
Est. average glucose Bld gHb Est-mCnc: 123 mg/dL
Hgb A1c MFr Bld: 5.9 % — ABNORMAL HIGH (ref 4.8–5.6)

## 2024-03-22 ENCOUNTER — Ambulatory Visit: Admitting: Nurse Practitioner
